# Patient Record
Sex: Female | Born: 1990 | Race: White | Hispanic: No | Marital: Married | Smoking: Never smoker
Health system: Southern US, Community
[De-identification: ages and names within clinical notes are randomized; demographics above are authoritative.]

## PROBLEM LIST (undated history)

## (undated) DIAGNOSIS — F439 Reaction to severe stress, unspecified: Secondary | ICD-10-CM

## (undated) DIAGNOSIS — R87612 Low grade squamous intraepithelial lesion on cytologic smear of cervix (LGSIL): Secondary | ICD-10-CM

## (undated) DIAGNOSIS — F32A Depression, unspecified: Secondary | ICD-10-CM

## (undated) DIAGNOSIS — R7989 Other specified abnormal findings of blood chemistry: Secondary | ICD-10-CM

## (undated) DIAGNOSIS — F329 Major depressive disorder, single episode, unspecified: Secondary | ICD-10-CM

## (undated) HISTORY — DX: Low grade squamous intraepithelial lesion on cytologic smear of cervix (LGSIL): R87.612

## (undated) HISTORY — DX: Reaction to severe stress, unspecified: F43.9

## (undated) HISTORY — DX: Depression, unspecified: F32.A

## (undated) HISTORY — PX: NO PAST SURGERIES: SHX2092

## (undated) HISTORY — DX: Other specified abnormal findings of blood chemistry: R79.89

## (undated) HISTORY — DX: Major depressive disorder, single episode, unspecified: F32.9

---

## 2015-08-08 ENCOUNTER — Encounter: Payer: Self-pay | Admitting: Obstetrics & Gynecology

## 2015-08-18 ENCOUNTER — Ambulatory Visit (INDEPENDENT_AMBULATORY_CARE_PROVIDER_SITE_OTHER): Payer: 59 | Admitting: Obstetrics and Gynecology

## 2015-08-18 ENCOUNTER — Encounter: Payer: Self-pay | Admitting: Obstetrics and Gynecology

## 2015-08-18 VITALS — BP 110/64 | HR 64 | Resp 20 | Ht 63.0 in | Wt 137.8 lb

## 2015-08-18 DIAGNOSIS — Q529 Congenital malformation of female genitalia, unspecified: Secondary | ICD-10-CM | POA: Diagnosis not present

## 2015-08-18 DIAGNOSIS — N912 Amenorrhea, unspecified: Secondary | ICD-10-CM

## 2015-08-18 DIAGNOSIS — N926 Irregular menstruation, unspecified: Secondary | ICD-10-CM | POA: Diagnosis not present

## 2015-08-18 DIAGNOSIS — Z0000 Encounter for general adult medical examination without abnormal findings: Secondary | ICD-10-CM | POA: Diagnosis not present

## 2015-08-18 LAB — ESTRADIOL: Estradiol: 58 pg/mL

## 2015-08-18 LAB — POCT URINE PREGNANCY: Preg Test, Ur: NEGATIVE

## 2015-08-18 LAB — PROLACTIN: Prolactin: 7.5 ng/mL

## 2015-08-18 LAB — POCT URINALYSIS DIPSTICK
Bilirubin, UA: NEGATIVE
Glucose, UA: NEGATIVE
KETONES UA: NEGATIVE
Leukocytes, UA: NEGATIVE
Nitrite, UA: NEGATIVE
PH UA: 5
PROTEIN UA: NEGATIVE
RBC UA: NEGATIVE
Urobilinogen, UA: NEGATIVE

## 2015-08-18 LAB — FSH/LH
FSH: 6.7 m[IU]/mL
LH: 17.3 m[IU]/mL

## 2015-08-18 LAB — TSH: TSH: 1.31 mIU/L

## 2015-08-18 MED ORDER — NORETHIN-ETH ESTRAD-FE BIPHAS 1 MG-10 MCG / 10 MCG PO TABS: 1.0000 | ORAL_TABLET | Freq: Every day | ORAL | Status: DC

## 2015-08-18 NOTE — Progress Notes (Signed)
Patient ID: Carolyn Jefferson, female   DOB: November 22, 1990, 25 y.o.   MRN: [id number] 25 y.o. G0P0000 Single Caucasian female here for  Problem visit for irregular menses and inability to have intercourse.  Always had irregular cycles in high school. Has been on combined oral contraceptives for cycle regulation.  Used a pill that had iron in it and did well, but it was expensive.  Loestrin Fe.  Stopped OPCs in June due to depression and suicidal ideation side effects.   Has had counseling at [facility] for depression.  Suicidal ideation resolved.  No medical therapy.   Has had only 2 cycles since then.  Cycle in January 2017 and March 2017.  Denies galactorrhea.  No headaches other than caffeine withdrawal.  No hair growth.   Has tried to be sexually active but not able to have penetration.  Has not used tampons.  Partner is 11 years older.  Patient states he is very patient with her.   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  From [facility].   UPT negative today here.   PCP:   None  Patient's last menstrual period was 07/09/2015 (exact date).     Period Pattern: (!) Irregular Menstrual Flow: Moderate Dysmenorrhea: (!) Mild Dysmenorrhea Symptoms: Cramping     Sexually active: No. female The current method of family planning is condoms --would use if sexually active.    Exercising: No.   Smoker:  no  Health Maintenance: Pap:  never History of abnormal Pap:  n/a MMG:  n/a Colonoscopy:  n/a BMD:   n/a  Result  n/a TDaP:  2012 Gardasil:   no Screening Labs:  Hb today: 14.2, Urine today: Neg   reports that she has never smoked. She does not have any smokeless tobacco history on file. She reports that she drinks about 1.8 - 3.0 oz of alcohol per week. She reports that she does not use illicit drugs.  Past Medical History  Diagnosis Date  . Depression     while on OCPs    History reviewed. No pertinent past surgical history.  Current Outpatient Prescriptions  Medication Sig  Dispense Refill  . Norethindrone-Ethinyl Estradiol-Fe Biphas (LO LOESTRIN FE) 1 MG-10 MCG / 10 MCG tablet Take 1 tablet by mouth daily. 1 Package 11   No current facility-administered medications for this visit.    Family History  Problem Relation Age of Onset  . Ulcerative colitis Paternal Grandfather   . Stroke Paternal Grandfather   . Cancer Other     colon ca  . Cancer Other     colon ca  . Diabetes Maternal Grandfather     ROS:  Pertinent items are noted in HPI.  Otherwise, a comprehensive ROS was negative.  Exam:   BP 110/64 mmHg  Pulse 64  Resp 20  Ht  (1.6 m)  Wt 137 lb 12.8 oz (62.506 kg)  BMI 24.42 kg/m2  LMP 07/09/2015 (Exact Date)    General appearance: alert, cooperative and appears stated age Head: Normocephalic, without obvious abnormality, atraumatic Neck: no adenopathy, supple, symmetrical, trachea midline and thyroid normal to inspection and palpation Lungs: clear to auscultation bilaterally Breasts: normal appearance, no masses or tenderness, Inspection negative, No nipple retraction or dimpling, No nipple discharge or bleeding, No axillary or supraclavicular adenopathy Heart: regular rate and rhythm Abdomen: incisions:  No.    , soft, non-tender; no masses, no organomegaly Extremities: extremities normal, atraumatic, no cyanosis or edema Skin: Skin color, texture, turgor normal. No rashes or lesions Lymph  nodes: Cervical, supraclavicular, and axillary nodes normal. No abnormal inguinal nodes palpated Neurologic: Grossly normal  Pelvic: External genitalia:  no lesions              Urethra:  normal appearing urethra with no masses, tenderness or lesions              Bartholins and Skenes: normal                 Vagina:  microperforate hymen.  Able to place small cotton Qtip only.              Cervix:  Not able to do speculum exam.              Pap taken: No. Bimanual Exam:  Uterus:  normal size, contour, position, consistency, mobility, non-tender  - bimanual done as transrectal exam.              Adnexa: normal adnexa and no mass, fullness, tenderness              Rectum:  No lesions noted.              Anus:  normal sphincter tone, no lesions  Chaperone was present for exam.  Assessment:     Oligomenorrhea.  UPT negative.  Intolerance of some combined oral contraceptives in the past with depression.  Able to take LoEstrin.  Microperforate hymen.   Plan:   Discussion of oligomenorrhea and treatment options - progesterone periodically, combined oral contraceptives, progesterone contraceptives. Labs performed.  Yes.  .   See orders.  TSH, prolactin, FSH, LH, estradiol. Prescription medication(s) given.  Yes.  .  See orders. LoLoEstrin Rx.  I discussed hymenal remnants and microperforate hymen.  I discussed hymenotomy - risks and benefits.  Risks include but are not limited to bleeding, infection, damage to surrounding organs, reactions to anesthesia.  Will pursue this.  Follow up annually and prn.   __45____ minutes face to face time of which over 50% was spent in counseling.     After visit summary provided.

## 2015-08-21 ENCOUNTER — Encounter: Payer: Self-pay | Admitting: Obstetrics and Gynecology

## 2015-08-21 ENCOUNTER — Telehealth: Payer: Self-pay | Admitting: *Deleted

## 2015-08-21 LAB — HEMOGLOBIN, FINGERSTICK: Hemoglobin, fingerstick: 14.2 g/dL (ref 12.0–16.0)

## 2015-08-21 NOTE — Telephone Encounter (Addendum)
Routing to Dr. Edward JollySilva to review and advise.   Message left to return call to [place] at [phone].

## 2015-08-21 NOTE — Telephone Encounter (Signed)
Spoke with patient. She is planning for surgical procedure with Dr. Edward JollySilva.  Advised patient that Dr. Edward JollySilva will need time to send information to surgical nurse, Billie RuddySally Yeakley, RN and Insurance coordinators. She is advised after benefits are checked, she is contacted to receive the information.  I advised that Dr. Edward JollySilva has been in surgery and that she will be contacted with information as available.  Patient states she will call back tomorrow for update.

## 2015-08-21 NOTE — Telephone Encounter (Signed)
Patient calling to check on procedure to make sure it's been pre-certed. Patient unsure of which procedure she needs to have done? Best # to reach: [phone]

## 2015-08-21 NOTE — Telephone Encounter (Signed)
Please precert and schedule excision of hymenal remnant.  Patient has a microperforate hymen.  Time needed 30 minutes.   Patient is very anxious to proceed forward with scheduling.

## 2015-08-22 NOTE — Telephone Encounter (Signed)
Chart to business office for precert.

## 2015-08-22 NOTE — Telephone Encounter (Signed)
Called patient to review benefits for a recommended procedure. Left Voicemail requesting a call back. °

## 2015-08-22 NOTE — Telephone Encounter (Signed)
Call to patient. Per DPR, ok to leave message on VM. Left message first available date is 09-05-15. Can call back and let me know if this works for her.

## 2015-08-22 NOTE — Telephone Encounter (Signed)
Spoke with pt regarding benefit for surgery. Patient understood and agreeable. Patient ready to schedule. Patient provided surgery deposit over the phone. Patient aware this is professional benefit only. Patient aware will be contacted by hospital for separate benefits. [place]ing to [place] for scheduling

## 2015-08-23 ENCOUNTER — Encounter (HOSPITAL_COMMUNITY): Payer: Self-pay | Admitting: *Deleted

## 2015-08-23 NOTE — Telephone Encounter (Signed)
Late entry: Return call to patient at approximately 1100. Patient desires to proceed with surgery date of 09-05-15. [facility] 1100 [facility]. Surgery instruction sheet reviewed with patient and printed copy mailed. See copy scanned to chart.   Routing to provider for final review. Patient agreeable to disposition. Will close encounter.

## 2015-08-23 NOTE — Telephone Encounter (Signed)
Return call to Sally. °

## 2015-08-23 NOTE — Telephone Encounter (Signed)
Patient left message on our voicemail to hold date 09/05/15 for surgery. Patient says she will call this morning to confirm date. Best # to reach: [phone]

## 2015-08-24 ENCOUNTER — Telehealth: Payer: Self-pay | Admitting: *Deleted

## 2015-08-24 NOTE — Telephone Encounter (Signed)
Call to patient. Left message to call back. Calling to schedule pre-op appointment with Dr Edward JollySilva.

## 2015-08-25 NOTE — Telephone Encounter (Signed)
Routing to provider for final review. Patient agreeable to disposition. Will close encounter.     

## 2015-08-25 NOTE — Telephone Encounter (Signed)
Patient called back scheduled pre-op for 08/28/15 at 11:30 with Dr. Edward JollySilva.

## 2015-08-28 ENCOUNTER — Encounter: Payer: Self-pay | Admitting: Obstetrics and Gynecology

## 2015-08-28 ENCOUNTER — Ambulatory Visit (INDEPENDENT_AMBULATORY_CARE_PROVIDER_SITE_OTHER): Payer: 59 | Admitting: Obstetrics and Gynecology

## 2015-08-28 VITALS — BP 120/66 | HR 72 | Resp 16 | Ht 63.0 in | Wt 138.0 lb

## 2015-08-28 DIAGNOSIS — N898 Other specified noninflammatory disorders of vagina: Secondary | ICD-10-CM

## 2015-08-28 DIAGNOSIS — Z3009 Encounter for other general counseling and advice on contraception: Secondary | ICD-10-CM | POA: Diagnosis not present

## 2015-08-28 NOTE — Progress Notes (Signed)
Patient ID: Carolyn Jefferson, female   DOB: 13-Feb-1991, 25 y.o.   MRN: [id number] GYNECOLOGY  VISIT   HPI: 25 y.o.   Single  Caucasian  female   G0P0000 with Patient's last menstrual period was 07/09/2015 (exact date).   here for surgical consult and discussion of contraceptive options.  Has a microperforate hymen.  Would like surgery for correction.  Would like to become sexually active and use tampons.   Took LoLoEstrin for only a new days and stopped it. Feels like she had a stressful moment that she did not respond well to these stresses.  She felt herself getting angry.  This is what happened in the past, and so she stopped OCPs.  She did well in the past with LoEstrin.  Menses are at least every other month.  Normal TSH, prolactin, and estradiol. LH >> FSH.  GYNECOLOGIC HISTORY: Patient's last menstrual period was 07/09/2015 (exact date). Contraception:  Condoms/LoLoEstrin Menopausal hormone therapy:  n/a Last mammogram:  n/a Last pap smear:   Never        OB History    Gravida Para Term Preterm AB TAB SAB Ectopic Multiple Living           There are no active problems to display for this patient.   Past Medical History  Diagnosis Date  . Depression     while on OCPs    No past surgical history on file.  Current Outpatient Prescriptions  Medication Sig Dispense Refill  . acetaminophen (TYLENOL) 325 MG tablet Take 650 mg by mouth every 6 (six) hours as needed for mild pain or headache.    . Norethindrone-Ethinyl Estradiol-Fe Biphas (LO LOESTRIN FE) 1 MG-10 MCG / 10 MCG tablet Take 1 tablet by mouth daily. 1 Package 11   No current facility-administered medications for this visit.     ALLERGIES: Review of patient's allergies indicates no known allergies.  Family History  Problem Relation Age of Onset  . Ulcerative colitis Paternal Grandfather   . Stroke Paternal Grandfather   . Cancer Other     colon ca  . Cancer Other     colon ca  .  Diabetes Maternal Grandfather     Social History   Social History  . Marital Status: Single    Spouse Name: N/A  . Number of Children: N/A  . Years of Education: N/A   Occupational History  . Not on file.   Social History Main Topics  . Smoking status: Never Smoker   . Smokeless tobacco: Not on file  . Alcohol Use: 1.8 - 3.0 oz/week    3-5 Standard drinks or equivalent per week     Comment: 2-5 drinks per week  . Drug Use: No  . Sexual Activity: No     Comment: never sexually active   Other Topics Concern  . Not on file   Social History Narrative    ROS:  Pertinent items are noted in HPI.  PHYSICAL EXAMINATION:    LMP 07/09/2015 (Exact Date)    General appearance: alert, cooperative and appears stated age  ASSESSMENT  Intolerance of combined oral contraceptive.  Irregular menses.  Likely anovulatory.  Microperforate hymen.   PLAN  Discussion of contraceptive options.  Micronor, barrier methods, sponge, IUDs.  Patient will use condoms and spermicide.  I discussed emergency contraception - Plan B and Ella.  Discussion of hymen revision surgery.  Risks, benefits, and alternatives discussed.  Risks include  but are not limited to bleeding, infection, damage to surrounding organs, reaction to anesthesia, DVT/PE, death. Surgical expectations and recovery discussed. Patient wishes to proceed.    An After Visit Summary was printed and given to the patient.  __15____ minutes face to face time of which over 50% was spent in counseling.

## 2015-08-29 ENCOUNTER — Telehealth: Payer: Self-pay | Admitting: Obstetrics and Gynecology

## 2015-08-29 NOTE — Telephone Encounter (Signed)
Patient is scheduled for hymenectomy-excision of hymenal remnant on 09/05/2015 with Dr.Silva. Would like to know if she will be able to play laser tag on 09/14/2015.  Routing to Dr.Miller as Dr.Silva is out of the office today.

## 2015-08-29 NOTE — Telephone Encounter (Signed)
Return call to patient, left message to call back. 

## 2015-08-29 NOTE — Telephone Encounter (Signed)
I will wait for Dr. Edward JollySilva to make this recommendation.  Routing to her.

## 2015-08-29 NOTE — Telephone Encounter (Signed)
Patient is scheduled for surgery on May 9th and is wondering if she will be ok to laser tag on May 18th. Ok to leave a detailed message on voicemail.

## 2015-08-29 NOTE — Telephone Encounter (Signed)
I would like to make recommendations for activity level after the surgery is done.  She will probably have sutures and need decreased activity for about 2 weeks post op, meaning no heavy exercise until I see her back for a 2 week check up.

## 2015-09-04 MED ORDER — DEXTROSE 5 % IV SOLN
2.0000 g | INTRAVENOUS | Status: AC
  Administered 2015-09-05: 2 g via INTRAVENOUS
  Filled 2015-09-04: qty 2

## 2015-09-04 NOTE — H&P (Signed)
Progress Notes      Carolyn SallesBrook E Amundson C Silva, MD at 08/28/2015 11:45 AM     Status: Signed       Expand All Collapse All   Patient ID: Carolyn BumpersHaleigh Jefferson, female DOB: Oct 11, 1990, 25 y.o. MRN: [id number] GYNECOLOGY VISIT  HPI: 25 y.o. Single Caucasian female  G0P0000 with Patient's last menstrual period was 07/09/2015 (exact date).  here for surgical consult and discussion of contraceptive options.  Has a microperforate hymen.  Would like surgery for correction.  Would like to become sexually active and use tampons.   Took LoLoEstrin for only a new days and stopped it. Feels like she had a stressful moment that she did not respond well to these stresses.  She felt herself getting angry.  This is what happened in the past, and so she stopped OCPs.  She did well in the past with LoEstrin.  Menses are at least every other month.  Normal TSH, prolactin, and estradiol. LH >> FSH.  GYNECOLOGIC HISTORY: Patient's last menstrual period was 07/09/2015 (exact date). Contraception: Condoms/LoLoEstrin Menopausal hormone therapy: n/a Last mammogram: n/a Last pap smear: Never   OB History    Gravida Para Term Preterm AB TAB SAB Ectopic Multiple Living   0 0 0 0 0 0 0 0 0 0        There are no active problems to display for this patient.   Past Medical History  Diagnosis Date  . Depression     while on OCPs    No past surgical history on file.  Current Outpatient Prescriptions  Medication Sig Dispense Refill  . acetaminophen (TYLENOL) 325 MG tablet Take 650 mg by mouth every 6 (six) hours as needed for mild pain or headache.    . Norethindrone-Ethinyl Estradiol-Fe Biphas (LO LOESTRIN FE) 1 MG-10 MCG / 10 MCG tablet Take 1 tablet by mouth daily. 1 Package 11   No current facility-administered medications for this visit.     ALLERGIES: Review of patient's allergies indicates no known  allergies.  Family History  Problem Relation Age of Onset  . Ulcerative colitis Paternal Grandfather   . Stroke Paternal Grandfather   . Cancer Other     colon ca  . Cancer Other     colon ca  . Diabetes Maternal Grandfather     Social History   Social History  . Marital Status: Single    Spouse Name: N/A  . Number of Children: N/A  . Years of Education: N/A   Occupational History  . Not on file.   Social History Main Topics  . Smoking status: Never Smoker   . Smokeless tobacco: Not on file  . Alcohol Use: 1.8 - 3.0 oz/week    3-5 Standard drinks or equivalent per week     Comment: 2-5 drinks per week  . Drug Use: No  . Sexual Activity: No     Comment: never sexually active   Other Topics Concern  . Not on file   Social History Narrative    ROS: Pertinent items are noted in HPI.  PHYSICAL EXAMINATION: 08/18/15 OFFICE VISIT  Exam:  BP 110/64 mmHg  Pulse 64  Resp 20  Ht 5\' 3"  (1.6 m)  Wt 137 lb 12.8 oz (62.506 kg)  BMI 24.42 kg/m2  LMP 07/09/2015 (Exact Date)  General appearance: alert, cooperative and appears stated age Head: Normocephalic, without obvious abnormality, atraumatic Neck: no adenopathy, supple, symmetrical, trachea midline and thyroid normal to inspection and palpation Lungs: clear to auscultation  bilaterally Breasts: normal appearance, no masses or tenderness, Inspection negative, No nipple retraction or dimpling, No nipple discharge or bleeding, No axillary or supraclavicular adenopathy Heart: regular rate and rhythm Abdomen: incisions: No. , soft, non-tender; no masses, no organomegaly Extremities: extremities normal, atraumatic, no cyanosis or edema Skin: Skin color, texture, turgor normal. No rashes or lesions Lymph nodes: Cervical, supraclavicular, and axillary nodes normal. No abnormal inguinal nodes palpated Neurologic: Grossly  normal  Pelvic: External genitalia: no lesions  Urethra: normal appearing urethra with no masses, tenderness or lesions  Bartholins and Skenes: normal   Vagina: microperforate hymen. Able to place small cotton Qtip only.  Cervix: Not able to do speculum exam.  Pap taken: No. Bimanual Exam: Uterus: normal size, contour, position, consistency, mobility, non-tender - bimanual done as transrectal exam.  Adnexa: normal adnexa and no mass, fullness, tenderness  Rectum: No lesions noted.  Anus: normal sphincter tone, no lesions   ASSESSMENT  Intolerance of combined oral contraceptive.  Irregular menses. Likely anovulatory.  Microperforate hymen.   PLAN  Discussion of contraceptive options. Micronor, barrier methods, sponge, IUDs.  Patient will use condoms and spermicide.  I discussed emergency contraception - Plan B and Ella.  Discussion of hymen revision surgery. Risks, benefits, and alternatives discussed. Risks include but are not limited to bleeding, infection, damage to surrounding organs, reaction to anesthesia, DVT/PE, death. Surgical expectations and recovery discussed. Patient wishes to proceed.   An After Visit Summary was printed and given to the patient.  __15____ minutes face to face time of which over 50% was spent in counseling.

## 2015-09-05 ENCOUNTER — Ambulatory Visit (HOSPITAL_COMMUNITY): Payer: 59 | Admitting: Anesthesiology

## 2015-09-05 ENCOUNTER — Ambulatory Visit (HOSPITAL_COMMUNITY)
Admission: RE | Admit: 2015-09-05 | Discharge: 2015-09-05 | Disposition: A | Discharge status: Home / Self Care | Payer: 59 | Source: Ambulatory Visit | Attending: Obstetrics and Gynecology | Admitting: Obstetrics and Gynecology

## 2015-09-05 ENCOUNTER — Encounter (HOSPITAL_COMMUNITY)
Admission: RE | Disposition: A | Discharge status: Home / Self Care | Payer: Self-pay | Source: Ambulatory Visit | Attending: Obstetrics and Gynecology

## 2015-09-05 ENCOUNTER — Encounter (HOSPITAL_COMMUNITY): Payer: Self-pay | Admitting: *Deleted

## 2015-09-05 DIAGNOSIS — Q523 Imperforate hymen: Secondary | ICD-10-CM | POA: Diagnosis not present

## 2015-09-05 DIAGNOSIS — N896 Tight hymenal ring: Secondary | ICD-10-CM | POA: Diagnosis not present

## 2015-09-05 HISTORY — PX: HYMENECTOMY: SHX5853

## 2015-09-05 LAB — CBC
HEMATOCRIT: 41.8 % (ref 36.0–46.0)
HEMOGLOBIN: 13.6 g/dL (ref 12.0–15.0)
MCH: 27.9 pg (ref 26.0–34.0)
MCHC: 32.5 g/dL (ref 30.0–36.0)
MCV: 85.7 fL (ref 78.0–100.0)
Platelets: 256 10*3/uL (ref 150–400)
RBC: 4.88 MIL/uL (ref 3.87–5.11)
RDW: 14.7 % (ref 11.5–15.5)
WBC: 11.1 10*3/uL — AB (ref 4.0–10.5)

## 2015-09-05 LAB — PREGNANCY, URINE: Preg Test, Ur: NEGATIVE

## 2015-09-05 SURGERY — HYMENECTOMY
Anesthesia: General

## 2015-09-05 MED ORDER — ONDANSETRON HCL 4 MG/2ML IJ SOLN: 4.0000 mg | Freq: Once | INTRAMUSCULAR | Status: DC | PRN

## 2015-09-05 MED ORDER — KETOROLAC TROMETHAMINE 30 MG/ML IJ SOLN
INTRAMUSCULAR | Status: DC | PRN
  Administered 2015-09-05: 30 mg via INTRAVENOUS

## 2015-09-05 MED ORDER — OXYCODONE HCL 5 MG PO TABS: 5.0000 mg | ORAL_TABLET | Freq: Once | ORAL | Status: DC | PRN

## 2015-09-05 MED ORDER — LIDOCAINE-EPINEPHRINE 0.5 %-1:200000 IJ SOLN
INTRAMUSCULAR | Status: DC | PRN
  Administered 2015-09-05: 6 mL

## 2015-09-05 MED ORDER — DEXAMETHASONE SODIUM PHOSPHATE 4 MG/ML IJ SOLN
INTRAMUSCULAR | Status: AC
  Filled 2015-09-05: qty 1

## 2015-09-05 MED ORDER — LIDOCAINE HCL (CARDIAC) 20 MG/ML IV SOLN
INTRAVENOUS | Status: AC
  Filled 2015-09-05: qty 5

## 2015-09-05 MED ORDER — PROPOFOL 10 MG/ML IV BOLUS
INTRAVENOUS | Status: AC
  Filled 2015-09-05: qty 20

## 2015-09-05 MED ORDER — KETOROLAC TROMETHAMINE 30 MG/ML IJ SOLN
INTRAMUSCULAR | Status: AC
  Filled 2015-09-05: qty 1

## 2015-09-05 MED ORDER — ONDANSETRON HCL 4 MG/2ML IJ SOLN
INTRAMUSCULAR | Status: DC | PRN
  Administered 2015-09-05: 4 mg via INTRAVENOUS

## 2015-09-05 MED ORDER — GLYCOPYRROLATE 0.2 MG/ML IJ SOLN
INTRAMUSCULAR | Status: AC
  Filled 2015-09-05: qty 1

## 2015-09-05 MED ORDER — SCOPOLAMINE 1 MG/3DAYS TD PT72
1.0000 | MEDICATED_PATCH | Freq: Once | TRANSDERMAL | Status: DC
  Administered 2015-09-05: 1.5 mg via TRANSDERMAL

## 2015-09-05 MED ORDER — SCOPOLAMINE 1 MG/3DAYS TD PT72
MEDICATED_PATCH | TRANSDERMAL | Status: AC
  Administered 2015-09-05: 1.5 mg via TRANSDERMAL
  Filled 2015-09-05: qty 1

## 2015-09-05 MED ORDER — LACTATED RINGERS IV SOLN
INTRAVENOUS | Status: DC
  Administered 2015-09-05 (×2): via INTRAVENOUS

## 2015-09-05 MED ORDER — LIDOCAINE HCL (CARDIAC) 20 MG/ML IV SOLN
INTRAVENOUS | Status: DC | PRN
Start: 2015-09-05 — End: 2015-09-05
  Administered 2015-09-05: 100 mg via INTRAVENOUS

## 2015-09-05 MED ORDER — PROPOFOL 10 MG/ML IV BOLUS
INTRAVENOUS | Status: DC | PRN
  Administered 2015-09-05: 200 mg via INTRAVENOUS

## 2015-09-05 MED ORDER — GLYCOPYRROLATE 0.2 MG/ML IJ SOLN
INTRAMUSCULAR | Status: DC | PRN
  Administered 2015-09-05: 0.1 mg via INTRAVENOUS

## 2015-09-05 MED ORDER — LIDOCAINE-EPINEPHRINE 0.5 %-1:200000 IJ SOLN
INTRAMUSCULAR | Status: AC
  Filled 2015-09-05: qty 1

## 2015-09-05 MED ORDER — SODIUM CHLORIDE 0.9 % IR SOLN
Status: DC | PRN
  Administered 2015-09-05: 1000 mL

## 2015-09-05 MED ORDER — LACTATED RINGERS IV SOLN: INTRAVENOUS | Status: DC

## 2015-09-05 MED ORDER — OXYCODONE-ACETAMINOPHEN 5-325 MG PO TABS: 1.0000 | ORAL_TABLET | ORAL | Status: DC | PRN

## 2015-09-05 MED ORDER — OXYCODONE HCL 5 MG/5ML PO SOLN: 5.0000 mg | Freq: Once | ORAL | Status: DC | PRN

## 2015-09-05 MED ORDER — FENTANYL CITRATE (PF) 100 MCG/2ML IJ SOLN
INTRAMUSCULAR | Status: AC
  Filled 2015-09-05: qty 2

## 2015-09-05 MED ORDER — DEXAMETHASONE SODIUM PHOSPHATE 10 MG/ML IJ SOLN
INTRAMUSCULAR | Status: DC | PRN
  Administered 2015-09-05: 4 mg via INTRAVENOUS

## 2015-09-05 MED ORDER — MIDAZOLAM HCL 2 MG/2ML IJ SOLN
INTRAMUSCULAR | Status: AC
  Filled 2015-09-05: qty 2

## 2015-09-05 MED ORDER — FENTANYL CITRATE (PF) 100 MCG/2ML IJ SOLN: 25.0000 ug | INTRAMUSCULAR | Status: DC | PRN

## 2015-09-05 MED ORDER — MIDAZOLAM HCL 2 MG/2ML IJ SOLN
INTRAMUSCULAR | Status: DC | PRN
  Administered 2015-09-05: 2 mg via INTRAVENOUS

## 2015-09-05 MED ORDER — ONDANSETRON HCL 4 MG/2ML IJ SOLN
INTRAMUSCULAR | Status: AC
  Filled 2015-09-05: qty 2

## 2015-09-05 MED ORDER — FENTANYL CITRATE (PF) 100 MCG/2ML IJ SOLN
INTRAMUSCULAR | Status: DC | PRN
  Administered 2015-09-05 (×2): 50 ug via INTRAVENOUS

## 2015-09-05 MED ORDER — IBUPROFEN 800 MG PO TABS: 800.0000 mg | ORAL_TABLET | Freq: Three times a day (TID) | ORAL | Status: DC | PRN

## 2015-09-05 SURGICAL SUPPLY — 14 items
CLOTH BEACON ORANGE TIMEOUT ST (SAFETY) ×3 IMPLANT
DECANTER SPIKE VIAL GLASS SM (MISCELLANEOUS) ×3 IMPLANT
GLOVE BIO SURGEON STRL SZ 6.5 (GLOVE) ×2 IMPLANT
GLOVE BIO SURGEONS STRL SZ 6.5 (GLOVE) ×1
GLOVE BIOGEL PI IND STRL 7.0 (GLOVE) ×1 IMPLANT
GLOVE BIOGEL PI INDICATOR 7.0 (GLOVE) ×2
GOWN STRL REUS W/TWL LRG LVL3 (GOWN DISPOSABLE) ×6 IMPLANT
NEEDLE HYPO 22GX1.5 SAFETY (NEEDLE) ×3 IMPLANT
PACK VAGINAL WOMENS (CUSTOM PROCEDURE TRAY) ×3 IMPLANT
PAD OB MATERNITY 4.3X12.25 (PERSONAL CARE ITEMS) ×3 IMPLANT
SUT VIC AB 4-0 PS2 27 (SUTURE) ×3 IMPLANT
TOWEL OR 17X24 6PK STRL BLUE (TOWEL DISPOSABLE) ×6 IMPLANT
TRAY FOLEY CATH SILVER 14FR (SET/KITS/TRAYS/PACK) ×3 IMPLANT
WATER STERILE IRR 1000ML POUR (IV SOLUTION) ×3 IMPLANT

## 2015-09-05 NOTE — Anesthesia Preprocedure Evaluation (Signed)
Anesthesia Evaluation  Patient identified by MRN, date of birth, ID band Patient awake    Reviewed: Allergy & Precautions, NPO status , Patient's Chart, lab work & pertinent test results  Airway Mallampati: II  TM Distance: >3 FB Neck ROM: Full    Dental  (+) Teeth Intact, Dental Advisory Given   Pulmonary    breath sounds clear to auscultation       Cardiovascular  Rhythm:Regular Rate:Normal     Neuro/Psych    GI/Hepatic   Endo/Other    Renal/GU      Musculoskeletal   Abdominal   Peds  Hematology   Anesthesia Other Findings   Reproductive/Obstetrics                             Anesthesia Physical Anesthesia Plan  ASA: I  Anesthesia Plan:    Post-op Pain Management:    Induction: Intravenous  Airway Management Planned: LMA  Additional Equipment:   Intra-op Plan:   Post-operative Plan:   Informed Consent: I have reviewed the patients History and Physical, chart, labs and discussed the procedure including the risks, benefits and alternatives for the proposed anesthesia with the patient or authorized representative who has indicated his/her understanding and acceptance.   Dental advisory given  Plan Discussed with: CRNA and Anesthesiologist  Anesthesia Plan Comments:         Anesthesia Quick Evaluation

## 2015-09-05 NOTE — Transfer of Care (Signed)
Immediate Anesthesia Transfer of Care Note  Patient: Carolyn Jefferson  Procedure(s) Performed: Procedure(s): HYMENECTOMY excision of hymenal remnant  (N/A)  Patient Location: PACU  Anesthesia Type:General  Level of Consciousness: awake, alert  and oriented  Airway & Oxygen Therapy: Patient Spontanous Breathing and Patient connected to nasal cannula oxygen  Post-op Assessment: Report given to RN, Post -op Vital signs reviewed and stable and Patient moving all extremities  Post vital signs: Reviewed and stable  Last Vitals:  Filed Vitals:   09/05/15 1001  BP: 118/79  Pulse: 69  Temp: 36.8 C  Resp: 18    Last Pain: There were no vitals filed for this visit.    Patients Stated Pain Goal: 4 (09/05/15 1001)  Complications: No apparent anesthesia complications

## 2015-09-05 NOTE — Discharge Instructions (Addendum)
Carolyn Jefferson,  You did have a hymenal band which I removed.  The bottom part of the hymen was also very tall and partially closing the vagina, so I opened it there.  I am very pleased with the result!  You do have a series of sutures in the hymen itself.  These will dissolve.  I expect that the surgery site may sting a bit, so I am prescribing Motrin and Percocet for pain control.   Using an ice pack and taking sitz baths may also help to reduce discomfort.  Thank you,   Conley SimmondsBrook Silva, MD    Treatment for Imperforate Hymen, Care After Refer to this sheet in the next few weeks. These instructions provide you with information about caring for yourself after your procedure. Your health care provider may also give you more specific instructions. Your treatment has been planned according to current medical practices, but problems sometimes occur. Call your health care provider if you have any problems or questions after your procedure. WHAT TO EXPECT AFTER THE PROCEDURE  After your procedure, it is common to have:  Pain or discomfort at the incision site.  Pain or cramping in the vagina or lower abdomen. HOME CARE INSTRUCTIONS  Take medicines only as directed by your health care provider. Do not take aspirin because it can cause bleeding.  Your health care provider may give you topical cream or ointment to apply on the stitches. Use as directed.  Your health care provider may give you a prescription for pain medicines.  Do not have sex until your health care provider says it is okay.  Do not douche until your health care provider tells you it is okay.  Do not use tampons until your health care provider tells you it is okay.  Do not lift anything that is more than 5 lb (2.3 kg) until your health care provider tells you it is okay.  You may return to your usual diet unless you are having problems with nausea or vomiting.  You may apply ice to the area between your thighs (perineum) to prevent  swelling.  Put ice in a plastic bag.  Place a towel between your skin and the bag.  Leave the ice on for 20 minutes, 2-3 times per day.  A few days after the procedure, you may take warm sitz baths 2-3 times per day to help with any discomfort and healing.  Keep all follow-up visits as directed by your health care provider. This is important. SEEK MEDICAL CARE IF:  You have a fever.  You have signs of infection that may include:  Abnormal vaginal discharge, such as pus or bad-smelling drainage coming from the vagina.  Increased swelling.  Increased redness.  Increased pain.  You develop a rash.  You develop vaginal bleeding.  You have painful or bloody urination.  You have frequent vomiting and you are unable to keep fluids or fluids down.  You have problems with your medicines. SEEK IMMEDIATE MEDICAL CARE IF:  You become weak or you faint.  You have vaginal bleeding that does not stop.   This information is not intended to replace advice given to you by your health care provider. Make sure you discuss any questions you have with your health care provider.   Document Released: 05/06/2014 Document Reviewed: 05/06/2014 Elsevier Interactive Patient Education 2016 [employer]   Post Anesthesia Home Care Instructions  Activity: Get plenty of rest for the remainder of the day. A responsible adult should stay with  you for 24 hours following the procedure.  For the next 24 hours, DO NOT: -Drive a car -Advertising copywriter -Drink alcoholic beverages -Take any medication unless instructed by your physician -Make any legal decisions or sign important papers.  Meals: Start with liquid foods such as gelatin or soup. Progress to regular foods as tolerated. Avoid greasy, spicy, heavy foods. If nausea and/or vomiting occur, drink only clear liquids until the nausea and/or vomiting subsides. Call your physician if vomiting continues.  Special Instructions/Symptoms: Your  throat may feel dry or sore from the anesthesia or the breathing tube placed in your throat during surgery. If this causes discomfort, gargle with warm salt water. The discomfort should disappear within 24 hours.  If you had a scopolamine patch placed behind your ear for the management of post- operative nausea and/or vomiting:  1. The medication in the patch is effective for 72 hours, after which it should be removed.  Wrap patch in a tissue and discard in the trash. Wash hands thoroughly with soap and water. 2. You may remove the patch earlier than 72 hours if you experience unpleasant side effects which may include dry mouth, dizziness or visual disturbances. 3. Avoid touching the patch. Wash your hands with soap and water after contact with the patch.

## 2015-09-05 NOTE — Brief Op Note (Signed)
09/05/2015  12:03 PM  PATIENT:  Carolyn Jefferson  25 y.o. female  PRE-OPERATIVE DIAGNOSIS:  Microperforate hymen  POST-OPERATIVE DIAGNOSIS:  Hymenal remnant.  PROCEDURE:  Procedure(s): HYMENECTOMY excision of hymenal remnant  (N/A)  SURGEON:  Surgeon(s) and Role:    * Mei Suits E Ardell IsaacsAmundson C Silva, MD - Primary  PHYSICIAN ASSISTANT: NA  ASSISTANTS: none   ANESTHESIA:   local and LMA.  EBL:  Total I/O In: 1000 [I.V.:1000] Out: 70 [Urine:60; Blood:10]  BLOOD ADMINISTERED:none  DRAINS: none   LOCAL MEDICATIONS USED:  LIDOCAINE   SPECIMEN:  No Specimen  DISPOSITION OF SPECIMEN:  N/A  COUNTS:  YES  TOURNIQUET:  * No tourniquets in log *  DICTATION: .Other Dictation: Dictation Number    PLAN OF CARE: Discharge to home after PACU  PATIENT DISPOSITION:  PACU - hemodynamically stable.   Delay start of Pharmacological VTE agent (>24hrs) due to surgical blood loss or risk of bleeding: not applicable

## 2015-09-05 NOTE — Anesthesia Postprocedure Evaluation (Signed)
Anesthesia Post Note  Patient: Carolyn BumpersHaleigh Jefferson  Procedure(s) Performed: Procedure(s) (LRB): HYMENECTOMY excision of hymenal remnant  (N/A)  Patient location during evaluation: PACU Anesthesia Type: General Level of consciousness: awake, awake and alert and oriented Pain management: pain level controlled Vital Signs Assessment: post-procedure vital signs reviewed and stable Respiratory status: spontaneous breathing, nonlabored ventilation and respiratory function stable Cardiovascular status: blood pressure returned to baseline Anesthetic complications: no    Last Vitals:  Filed Vitals:   09/05/15 1001 09/05/15 1215  BP: 118/79 110/67  Pulse: 69 71  Temp: 36.8 C 36.1 C  Resp: 18 16    Last Pain:  Filed Vitals:   09/05/15 1222  PainSc: 3                  Laurinda Carreno COKER

## 2015-09-05 NOTE — Progress Notes (Signed)
Update to History and Physical  No marked change in status since office pre-op visit.   Patient examined.   OK to proceed with surgery. 

## 2015-09-05 NOTE — Anesthesia Procedure Notes (Signed)
Procedure Name: LMA Insertion Date/Time: 09/05/2015 11:09 AM Performed by: Elgie CongoMALINOVA, Ariyanna Oien H Pre-anesthesia Checklist: Patient identified, Emergency Drugs available, Patient being monitored and Suction available Patient Re-evaluated:Patient Re-evaluated prior to inductionOxygen Delivery Method: Circle system utilized Preoxygenation: Pre-oxygenation with 100% oxygen Intubation Type: IV induction LMA: LMA inserted LMA Size: 4.0 Number of attempts: 1 Placement Confirmation: positive ETCO2 and breath sounds checked- equal and bilateral Tube secured with: Tape Dental Injury: Teeth and Oropharynx as per pre-operative assessment

## 2015-09-06 ENCOUNTER — Encounter (HOSPITAL_COMMUNITY): Payer: Self-pay | Admitting: Obstetrics and Gynecology

## 2015-09-06 NOTE — Op Note (Signed)
NAMSantiago Bumpers:  EVANS, Ziggy               ACCOUNT NO.:  [id number]  MEDICAL RECORD NO.:  [id number]  LOCATION:  [facility]                          FACILITY:  [facility]  PHYSICIAN:  Randye LoboBrook E. Silva, M.D.   DATE OF BIRTH:  08-01-1990  DATE OF PROCEDURE:  09/05/2015 DATE OF DISCHARGE:  09/05/2015                              OPERATIVE REPORT   PREOPERATIVE DIAGNOSIS:  Microperforate hymen.  POSTOPERATIVE DIAGNOSIS:  Hymenal remnant.  PROCEDURE:  Excision of hymenal remnant.  SURGEON:  Randye LoboBrook E. Silva, M.D.  ANESTHESIA:  LMA, local with 1% lidocaine and 1:200,000.  IV FLUIDS:  1000 mL Ringer's lactate.  ESTIMATED BLOOD LOSS:  10 mL.  URINE OUTPUT:  60 mL.  COMPLICATIONS:  None.  INDICATIONS FOR THE PROCEDURE:  The patient is a 25 year old, gravida 710 Caucasian female, who presents with inability to use tampons and have sexual intercourse.  On office examination, the patient is noted to have a micro perforate hymen.  A plan is made to proceed with an excision of hymenal remnant and examination under anesthesia after the risks, benefits, and alternatives are reviewed.  FINDINGS:  Examination under anesthesia revealed a hymenal band which stretched from the 2 o' clock to the 9 o' clock position.  This measured approximately 1 cm in thickness.  The posterior portion of the hymen was very prominent from the 3 o'clock to the 8 o'clock position.  After excision of the hymenal remnant was performed, a speculum exam was possible and demonstrated a normal cervix and vaginal walls.  A bimanual exam demonstrated a normal and non-enlarged uterus.  No adnexal masses were appreciated.  SPECIMEN:  None.  DESCRIPTION OF THE PROCEDURE:  The patient was re-identified in the preoperative hold area.  She did receive cefotetan IV for antibiotic prophylaxis.  She received  PAS stockings for DVT prophylaxis.  In the operating room, the patient was placed in the dorsal lithotomy position with Allen stirrups.  The  patient was sterilely prepped and catheterized of urine with a red rubber catheter.  She was then sterilely draped.  An examination under anesthesia was began at this time.  A hemostat clamp was used to probe the area of the hymenal folds and the hymenal band was appreciated at this time.  The Foley catheter was placed inside the bladder and left to gravity drainage throughout the procedure.  The hymenal band was injected locally with 1% lidocaine with epinephrine 1:100,000.  The band was then incised in the midline with a scalpel.  The remainder of the hymen was examined at this time and the posterior portion was noted to be quite prominent and like a small curtain which covered over the posterior or inferior aspect of the hymen.  This area was then held with 2 Allis clamps, injected with local 1% lidocaine with 1:100,000 epinephrine and it was then incised vertically in order to create normal hymenal anatomy.  A series of interrupted 4-0 Vicryl sutures were then placed for hemostasis.  There was a small area at the 6 o'clock position of the hymen which was bleeding and this responded to a figure-of-eight suture of 2-0 Vicryl.  Hemostasis was good at this time.  A speculum  exam and a bimanual exam were performed.  The findings are as noted above.  This concluded the patient's procedure.  There were no complications. All needle, instrument, and sponge counts were correct.  The patient is escorted to the recovery room in stable and awake condition.     Randye Lobo, M.D.     BES/MEDQ  D:  09/05/2015  T:  09/06/2015  Job:  [id number]

## 2015-09-08 NOTE — Telephone Encounter (Signed)
Patient had surgery for a hymenectomy-excision of hymenal remnant on 09/05/2015 with Dr.Silva.   Routing to provider for final review. Patient agreeable to disposition. Will close encounter.

## 2015-09-22 ENCOUNTER — Encounter: Payer: Self-pay | Admitting: Obstetrics and Gynecology

## 2015-09-22 ENCOUNTER — Ambulatory Visit (INDEPENDENT_AMBULATORY_CARE_PROVIDER_SITE_OTHER): Payer: 59 | Admitting: Obstetrics and Gynecology

## 2015-09-22 VITALS — BP 110/68 | HR 70 | Ht 63.0 in | Wt 138.4 lb

## 2015-09-22 DIAGNOSIS — Z9889 Other specified postprocedural states: Secondary | ICD-10-CM

## 2015-09-22 NOTE — Progress Notes (Signed)
Patient ID: Carolyn Jefferson, female   DOB: [birth year], 25 y.o.   MRN: [id number] GYNECOLOGY  VISIT   HPI: 25 y.o.   Single  Caucasian  female   [id number] with Patient's last menstrual period was 08/28/2015 (exact date).   here for follow up HYMENECTOMY excision of hymenal remnant (N/A ) [[id number] CPT (R)].   Surgery on 09/05/15.  Did not need the oxycodone.  Used a few ibuprofen.  Bled for 10 days post op.  GYNECOLOGIC HISTORY: Patient's last menstrual period was 08/28/2015 (exact date). Contraception:  Condoms Menopausal hormone therapy:  n/a Last mammogram:  n/a Last pap smear:   never        OB History    Gravida Para Term Preterm AB TAB SAB Ectopic Multiple Living           There are no active problems to display for this patient.   Past Medical History  Diagnosis Date  . Depression     while on OCPs    Past Surgical History  Procedure Laterality Date  . No past surgeries    . Hymenectomy N/A 09/05/2015    Procedure: HYMENECTOMY excision of hymenal remnant ;  Surgeon: Patton Salles, MD;  Location: [facility];  Service: Gynecology;  Laterality: N/A;    No current outpatient prescriptions on file.   No current facility-administered medications for this visit.     ALLERGIES: Review of patient's allergies indicates no known allergies.  Family History  Problem Relation Age of Onset  . Ulcerative colitis Paternal Grandfather   . Stroke Paternal Grandfather   . Cancer Other     colon ca  . Cancer Other     colon ca  . Diabetes Maternal Grandfather     Social History   Social History  . Marital Status: Single    Spouse Name: N/A  . Number of Children: N/A  . Years of Education: N/A   Occupational History  . Not on file.   Social History Main Topics  . Smoking status: Never Smoker   . Smokeless tobacco: Not on file  . Alcohol Use: 1.8 - 3.0 oz/week    3-5 Standard drinks or equivalent per week     Comment: 2-5 drinks per week  .  Drug Use: No  . Sexual Activity: No     Comment: never sexually active   Other Topics Concern  . Not on file   Social History Narrative    ROS:  Pertinent items are noted in HPI.  PHYSICAL EXAMINATION:    BP 110/68 mmHg  Pulse 70  Ht  (1.6 m)  Wt 138 lb 6.4 oz (62.778 kg)  BMI 24.52 kg/m2  LMP 08/28/2015 (Exact Date)    General appearance: alert, cooperative and appears stated age    Pelvic: External genitalia:  no lesions.              Urethra:  normal appearing urethra with no masses, tenderness or lesions              Bartholins and Skenes: normal                 Vagina: 3 suture of Vicryl still present at the hymenal opening.              Introital exam:  Patency of the hymen noted.             Chaperone was  present for exam.  ASSESSMENT  Status post excision of hymenal remnant. Doing well.  PLAN  Wait to initiate any sexual activity for 2 additional weeks.  I recommended digital penetration prior to attempting actual intercourse.  Wait a full 6 weeks post op prior to attempting intercourse.  Use lubricants.  Condoms for pregnancy prevention.  Patient will consider a Paragard IUD.  Follow up in one year and prn.    An After Visit Summary was printed and given to the patient.  _15_____ minutes face to face time of which over 50% was spent in counseling.

## 2015-12-10 ENCOUNTER — Encounter: Payer: Self-pay | Admitting: Obstetrics and Gynecology

## 2015-12-12 ENCOUNTER — Telehealth: Payer: Self-pay | Admitting: *Deleted

## 2015-12-12 NOTE — Telephone Encounter (Signed)
Princes,  I tried to to reach you by phone to discuss your requests and was unable to reach you.  Please call the office and schedule an appointment with Dr Edward JollySilva. She will need to recheck the area from the surgery as well as discussing all of your contraceptive options, including the minipill, and the benefits and risks of each. This is much easier to do when you can talk with your provider directly. Please let me know when I can schedule your appointment.  Thank you,  Billie RuddySally Farhana Fellows, RN Clinical Supervisor   ===View-only below this line===   ----- Message -----    From: Santiago BumpersHaleigh Jefferson    Sent: 12/11/2015  9:57 PM EDT      To: Melony OverlyBrook A Silva, MD Subject: RE: Non-Urgent Medical Question  Hi Brook,  Thank you for your reply. I believe I have fully healed. I am not experiencing any discomfort; my problem is that my boyfriend has difficulty using condoms, so I wanted to explore alternative contraception. Do you think the minipill would be an effective alternative since the regular pill has proven to be too much hormones?  Take care,  Carolyn Jefferson

## 2015-12-12 NOTE — Telephone Encounter (Signed)
See previous My Chart messages from patient. Call to patient to schedule appointment to discuss contraceptive options.left message to call back.

## 2015-12-13 ENCOUNTER — Other Ambulatory Visit: Payer: Self-pay | Admitting: Obstetrics and Gynecology

## 2015-12-13 MED ORDER — NORETHINDRONE 0.35 MG PO TABS: 1.0000 | ORAL_TABLET | Freq: Every day | ORAL | 1 refills | Status: DC

## 2016-01-04 NOTE — Telephone Encounter (Signed)
No Patient response. Ok to close encounter?    

## 2016-01-05 NOTE — Telephone Encounter (Signed)
I had communication with patient through My Chart after receiving more information from her.  I started her on Micronor.  She will call back to let me know how she is doing. She does not need an additional post op check.  I have closed the encounter.

## 2016-01-24 ENCOUNTER — Other Ambulatory Visit: Payer: Self-pay | Admitting: Obstetrics and Gynecology

## 2016-01-24 ENCOUNTER — Encounter: Payer: Self-pay | Admitting: Obstetrics and Gynecology

## 2016-01-24 MED ORDER — NORETHINDRONE 0.35 MG PO TABS: 1.0000 | ORAL_TABLET | Freq: Every day | ORAL | 8 refills | Status: DC

## 2016-02-18 ENCOUNTER — Encounter: Payer: Self-pay | Admitting: Obstetrics and Gynecology

## 2016-02-19 MED ORDER — NORETHINDRONE 0.35 MG PO TABS: 1.0000 | ORAL_TABLET | Freq: Every day | ORAL | 2 refills | Status: DC

## 2016-04-29 DIAGNOSIS — F439 Reaction to severe stress, unspecified: Secondary | ICD-10-CM

## 2016-04-29 HISTORY — DX: Reaction to severe stress, unspecified: F43.9

## 2016-09-27 ENCOUNTER — Ambulatory Visit (INDEPENDENT_AMBULATORY_CARE_PROVIDER_SITE_OTHER): Payer: 59 | Admitting: Obstetrics and Gynecology

## 2016-09-27 ENCOUNTER — Other Ambulatory Visit (HOSPITAL_COMMUNITY)
Admission: RE | Admit: 2016-09-27 | Discharge: 2016-09-27 | Disposition: A | Discharge status: Home / Self Care | Payer: 59 | Source: Ambulatory Visit | Attending: Obstetrics and Gynecology | Admitting: Obstetrics and Gynecology

## 2016-09-27 ENCOUNTER — Encounter: Payer: Self-pay | Admitting: Obstetrics and Gynecology

## 2016-09-27 VITALS — BP 110/72 | HR 78 | Resp 16 | Ht 63.5 in | Wt 130.8 lb

## 2016-09-27 DIAGNOSIS — Z01411 Encounter for gynecological examination (general) (routine) with abnormal findings: Secondary | ICD-10-CM | POA: Insufficient documentation

## 2016-09-27 DIAGNOSIS — F439 Reaction to severe stress, unspecified: Secondary | ICD-10-CM

## 2016-09-27 DIAGNOSIS — Z01419 Encounter for gynecological examination (general) (routine) without abnormal findings: Secondary | ICD-10-CM

## 2016-09-27 DIAGNOSIS — N912 Amenorrhea, unspecified: Secondary | ICD-10-CM

## 2016-09-27 DIAGNOSIS — N870 Mild cervical dysplasia: Secondary | ICD-10-CM | POA: Diagnosis not present

## 2016-09-27 DIAGNOSIS — Z113 Encounter for screening for infections with a predominantly sexual mode of transmission: Secondary | ICD-10-CM | POA: Diagnosis present

## 2016-09-27 DIAGNOSIS — R87612 Low grade squamous intraepithelial lesion on cytologic smear of cervix (LGSIL): Secondary | ICD-10-CM

## 2016-09-27 HISTORY — DX: Low grade squamous intraepithelial lesion on cytologic smear of cervix (LGSIL): R87.612

## 2016-09-27 LAB — POCT URINE PREGNANCY: PREG TEST UR: NEGATIVE

## 2016-09-27 MED ORDER — SERTRALINE HCL 50 MG PO TABS
50.0000 mg | ORAL_TABLET | Freq: Every day | ORAL | 1 refills | Status: DC
Start: 2016-09-27 — End: 2016-11-01

## 2016-09-27 MED ORDER — NORETHINDRONE 0.35 MG PO TABS: 1.0000 | ORAL_TABLET | Freq: Every day | ORAL | 3 refills | Status: DC

## 2016-09-27 NOTE — Patient Instructions (Signed)
EXERCISE AND DIET:  We recommended that you start or continue a regular exercise program for good health. Regular exercise means any activity that makes your heart beat faster and makes you sweat.  We recommend exercising at least 30 minutes per day at least 3 days a week, preferably 4 or 5.  We also recommend a diet low in fat and sugar.  Inactivity, poor dietary choices and obesity can cause diabetes, heart attack, stroke, and kidney damage, among others.    ALCOHOL AND SMOKING:  Women should limit their alcohol intake to no more than 7 drinks/beers/glasses of wine (combined, not each!) per week. Moderation of alcohol intake to this level decreases your risk of breast cancer and liver damage. And of course, no recreational drugs are part of a healthy lifestyle.  And absolutely no smoking or even second hand smoke. Most people know smoking can cause heart and lung diseases, but did you know it also contributes to weakening of your bones? Aging of your skin?  Yellowing of your teeth and nails?  CALCIUM AND VITAMIN D:  Adequate intake of calcium and Vitamin D are recommended.  The recommendations for exact amounts of these supplements seem to change often, but generally speaking 600 mg of calcium (either carbonate or citrate) and 800 units of Vitamin D per day seems prudent. Certain women may benefit from higher intake of Vitamin D.  If you are among these women, your doctor will have told you during your visit.    PAP SMEARS:  Pap smears, to check for cervical cancer or precancers,  have traditionally been done yearly, although recent scientific advances have shown that most women can have pap smears less often.  However, every woman still should have a physical exam from her gynecologist every year. It will include a breast check, inspection of the vulva and vagina to check for abnormal growths or skin changes, a visual exam of the cervix, and then an exam to evaluate the size and shape of the uterus and  ovaries.  And after 24 years of age, a rectal exam is indicated to check for rectal cancers. We will also provide age appropriate advice regarding health maintenance, like when you should have certain vaccines, screening for sexually transmitted diseases, bone density testing, colonoscopy, mammograms, etc.   MAMMOGRAMS:  All women over 40 years old should have a yearly mammogram. Many facilities now offer a "3D" mammogram, which may cost around $50 extra out of pocket. If possible,  we recommend you accept the option to have the 3D mammogram performed.  It both reduces the number of women who will be called back for extra views which then turn out to be normal, and it is better than the routine mammogram at detecting truly abnormal areas.    COLONOSCOPY:  Colonoscopy to screen for colon cancer is recommended for all women at age 50.  We know, you hate the idea of the prep.  We agree, BUT, having colon cancer and not knowing it is worse!!  Colon cancer so often starts as a polyp that can be seen and removed at colonscopy, which can quite literally save your life!  And if your first colonoscopy is normal and you have no family history of colon cancer, most women don't have to have it again for 10 years.  Once every ten years, you can do something that may end up saving your life, right?  We will be happy to help you get it scheduled when you are ready.    Be sure to check your insurance coverage so you understand how much it will cost.  It may be covered as a preventative service at no cost, but you should check your particular policy.     Sertraline tablets What is this medicine? SERTRALINE (SER tra leen) is used to treat depression. It may also be used to treat obsessive compulsive disorder, panic disorder, post-trauma stress, premenstrual dysphoric disorder (PMDD) or social anxiety. This medicine may be used for other purposes; ask your health care provider or pharmacist if you have questions. COMMON BRAND  NAME(S): Zoloft What should I tell my health care provider before I take this medicine? They need to know if you have any of these conditions: -bleeding disorders -bipolar disorder or a family history of bipolar disorder -glaucoma -heart disease -high blood pressure -history of irregular heartbeat -history of low levels of calcium, magnesium, or potassium in the blood -if you often drink alcohol -liver disease -receiving electroconvulsive therapy -seizures -suicidal thoughts, plans, or attempt; a previous suicide attempt by you or a family member -take medicines that treat or prevent blood clots -thyroid disease -an unusual or allergic reaction to sertraline, other medicines, foods, dyes, or preservatives -pregnant or trying to get pregnant -breast-feeding How should I use this medicine? Take this medicine by mouth with a glass of water. Follow the directions on the prescription label. You can take it with or without food. Take your medicine at regular intervals. Do not take your medicine more often than directed. Do not stop taking this medicine suddenly except upon the advice of your doctor. Stopping this medicine too quickly may cause serious side effects or your condition may worsen. A special MedGuide will be given to you by the pharmacist with each prescription and refill. Be sure to read this information carefully each time. Talk to your pediatrician regarding the use of this medicine in children. While this drug may be prescribed for children as young as 7 years for selected conditions, precautions do apply. Overdosage: If you think you have taken too much of this medicine contact a poison control center or emergency room at once. NOTE: This medicine is only for you. Do not share this medicine with others. What if I miss a dose? If you miss a dose, take it as soon as you can. If it is almost time for your next dose, take only that dose. Do not take double or extra doses. What may  interact with this medicine? Do not take this medicine with any of the following medications: -cisapride -dofetilide -dronedarone -linezolid -MAOIs like Carbex, Eldepryl, Marplan, Nardil, and Parnate -methylene blue (injected into a vein) -pimozide -thioridazine This medicine may also interact with the following medications: -alcohol -amphetamines -aspirin and aspirin-like medicines -certain medicines for depression, anxiety, or psychotic disturbances -certain medicines for fungal infections like ketoconazole, fluconazole, posaconazole, and itraconazole -certain medicines for irregular heart beat like flecainide, quinidine, propafenone -certain medicines for migraine headaches like almotriptan, eletriptan, frovatriptan, naratriptan, rizatriptan, sumatriptan, zolmitriptan -certain medicines for sleep -certain medicines for seizures like carbamazepine, valproic acid, phenytoin -certain medicines that treat or prevent blood clots like warfarin, enoxaparin, dalteparin -cimetidine -digoxin -diuretics -fentanyl -isoniazid -lithium -NSAIDs, medicines for pain and inflammation, like ibuprofen or naproxen -other medicines that prolong the QT interval (cause an abnormal heart rhythm) -rasagiline -safinamide -supplements like St. John's wort, kava kava, valerian -tolbutamide -tramadol -tryptophan This list may not describe all possible interactions. Give your health care provider a list of all the medicines, herbs, non-prescription drugs, or   dietary supplements you use. Also tell them if you smoke, drink alcohol, or use illegal drugs. Some items may interact with your medicine. What should I watch for while using this medicine? Tell your doctor if your symptoms do not get better or if they get worse. Visit your doctor or health care professional for regular checks on your progress. Because it may take several weeks to see the full effects of this medicine, it is important to continue your  treatment as prescribed by your doctor. Patients and their families should watch out for new or worsening thoughts of suicide or depression. Also watch out for sudden changes in feelings such as feeling anxious, agitated, panicky, irritable, hostile, aggressive, impulsive, severely restless, overly excited and hyperactive, or not being able to sleep. If this happens, especially at the beginning of treatment or after a change in dose, call your health care professional. You may get drowsy or dizzy. Do not drive, use machinery, or do anything that needs mental alertness until you know how this medicine affects you. Do not stand or sit up quickly, especially if you are an older patient. This reduces the risk of dizzy or fainting spells. Alcohol may interfere with the effect of this medicine. Avoid alcoholic drinks. Your mouth may get dry. Chewing sugarless gum or sucking hard candy, and drinking plenty of water may help. Contact your doctor if the problem does not go away or is severe. What side effects may I notice from receiving this medicine? Side effects that you should report to your doctor or health care professional as soon as possible: -allergic reactions like skin rash, itching or hives, swelling of the face, lips, or tongue -anxious -black, tarry stools -changes in vision -confusion -elevated mood, decreased need for sleep, racing thoughts, impulsive behavior -eye pain -fast, irregular heartbeat -feeling faint or lightheaded, falls -feeling agitated, angry, or irritable -hallucination, loss of contact with reality -loss of balance or coordination -loss of memory -painful or prolonged erections -restlessness, pacing, inability to keep still -seizures -stiff muscles -suicidal thoughts or other mood changes -trouble sleeping -unusual bleeding or bruising -unusually weak or tired -vomiting Side effects that usually do not require medical attention (report to your doctor or health care  professional if they continue or are bothersome): -change in appetite or weight -change in sex drive or performance -diarrhea -increased sweating -indigestion, nausea -tremors This list may not describe all possible side effects. Call your doctor for medical advice about side effects. You may report side effects to FDA at 1-800-FDA-1088. Where should I keep my medicine? Keep out of the reach of children. Store at room temperature between 15 and 30 degrees C (59 and 86 degrees F). Throw away any unused medicine after the expiration date. NOTE: This sheet is a summary. It may not cover all possible information. If you have questions about this medicine, talk to your doctor, pharmacist, or health care provider.  2018 Elsevier/Gold Standard (2016-04-19 14:17:49)  

## 2016-09-27 NOTE — Progress Notes (Signed)
26 y.o. G0P0000 Single Caucasian female here for annual exam.    Doing well on Camilla.  Feeling good emotionally on this.  Not having cycles on this pill. Does not forget. Infrequent sex.  No change in partners.  Increased stress at work for the last 6 months. Lots of responsibility. Boyfriend works at the same company. Difficulty letting go of worry when gets home. Denies depression and suicidal ideation. Has done counseling in the past and does not feel her condition is that severe. FH of depression and use of SSRIs.  No prior STD testing.   Going to [place] with friends.  UPT today - negative.   PCP:   None.  Patient's last menstrual period was 05/30/2016.     Period Pattern: (!) Irregular Menstrual Flow: Light Menstrual Control: Maxi pad Dysmenorrhea: None     Sexually active: Yes.    The current method of family planning is oral progesterone-only contraceptive.    Exercising: No.  exercise Smoker:  no  Health Maintenance: Pap:  None? History of abnormal Pap:  no MMG:  none Colonoscopy:  none BMD:   none TDaP:  2012 Gardasil:   no HIV: donated blood Hep C: donated blood Screening Labs:  Hb today: none, Urine today: none,  poct upt-neg   reports that she has never smoked. She has never used smokeless tobacco. She reports that she drinks about 1.2 - 2.4 oz of alcohol per week . She reports that she does not use drugs.  Past Medical History:  Diagnosis Date  . Depression    while on OCPs    Past Surgical History:  Procedure Laterality Date  . HYMENECTOMY N/A 09/05/2015   Procedure: HYMENECTOMY excision of hymenal remnant ;  Surgeon: [name], MD;  Location: [facility];  Service: Gynecology;  Laterality: N/A;  . NO PAST SURGERIES      Current Outpatient Prescriptions  Medication Sig Dispense Refill  . norethindrone (MICRONOR,CAMILA,ERRIN) 0.35 MG tablet Take 1 tablet (0.35 mg total) by mouth daily. 3 Package 2   No current  facility-administered medications for this visit.     Family History  Problem Relation Age of Onset  . Ulcerative colitis Paternal Grandfather   . Stroke Paternal Grandfather   . Cancer Other        colon ca  . Cancer Other        colon ca  . Diabetes Maternal Grandfather     ROS:  Pertinent items are noted in HPI.  Otherwise, a comprehensive ROS was negative.  Exam:   BP 110/72 (BP Location: Right Arm, Patient Position: Sitting, Cuff Size: Normal)   Pulse 78   Resp 16   Ht 5' 3.5" (1.613 m)   Wt 130 lb 12.8 oz (59.3 kg)   LMP 05/30/2016 Comment: Continuous Birth Control  BMI 22.81 kg/m     General appearance: alert, cooperative and appears stated age Head: Normocephalic, without obvious abnormality, atraumatic Neck: no adenopathy, supple, symmetrical, trachea midline and thyroid normal to inspection and palpation Lungs: clear to auscultation bilaterally Breasts: normal appearance, no masses or tenderness, No nipple retraction or dimpling, No nipple discharge or bleeding, No axillary or supraclavicular adenopathy Heart: regular rate and rhythm Abdomen: soft, non-tender; no masses, no organomegaly Extremities: extremities normal, atraumatic, no cyanosis or edema Skin: Skin color, texture, turgor normal. No rashes or lesions Lymph nodes: Cervical, supraclavicular, and axillary nodes normal. No abnormal inguinal nodes palpated Neurologic: Grossly normal  Pelvic: External genitalia:  no lesions              Urethra:  normal appearing urethra with no masses, tenderness or lesions              Bartholins and Skenes: normal                 Vagina: normal appearing vagina with normal color and discharge, no lesions              Cervix: no lesions              Pap taken: Yes.   Bimanual Exam:  Uterus:  normal size, contour, position, consistency, mobility, non-tender              Adnexa: no mass, fullness, tenderness               Chaperone was present for exam.  Assessment:    Well woman visit with normal exam. Status post hymenectomy.  STD screening. Situational stress.  Plan: Mammogram screening discussed. Recommended self breast awareness. Pap and HR HPV as above. Guidelines for Calcium, Vitamin D, regular exercise program including cardiovascular and weight bearing exercise. GC/CT and trichomonas.  Will do serum STD testing when she returns for her recheck in 6 weeks.  Follow up annually and prn.   Additional counseling given.  Yes.  . ___15____ minutes face to face time of which over 50% was spent in counseling regarding situational stress.  Will start Zoloft 50 mg daily.  Discussed benefits, risks, and side effects.  Serotonin syndrome discussed. She declines counseling at this time, but will consider this.  After visit summary provided.

## 2016-10-03 ENCOUNTER — Encounter: Payer: Self-pay | Admitting: Obstetrics and Gynecology

## 2016-10-03 ENCOUNTER — Telehealth: Payer: Self-pay | Admitting: *Deleted

## 2016-10-03 LAB — CYTOLOGY - PAP
CHLAMYDIA, DNA PROBE: NEGATIVE
NEISSERIA GONORRHEA: NEGATIVE
Trichomonas: NEGATIVE

## 2016-10-03 NOTE — Telephone Encounter (Addendum)
-----   Message from [name], MD sent at 10/03/2016 12:34 PM EDT ----- Her testing for GC/CT and trichomonas were all negative.  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  These cells are abnormal, but it is not cancer.  At her current age, the guidelines recommend repeating a pap in 12 months.  There is no further evaluation or treatment because this is likely to resolve on its own.  Please enter recall 08.

## 2016-10-03 NOTE — Telephone Encounter (Signed)
08 recall placed.  

## 2016-10-03 NOTE — Telephone Encounter (Signed)
Spoke with patient, advised of results and recommendations as seen below per Dr. Edward JollySilva. Patient asking cause of LGSIL, advised abnormal cells at her age most commonly related to HPV, will likely resolve on its own, important to f/u with AEX as recommended. Patient ask how common is hpv? Advised very common, estimated 80 percent of SA people contract hpv. Patient plans to further discuss results at f/u visit on 11/01/16 with Dr. Edward JollySilva. Patient verbalizes understanding and is agreeable.     Routing to provider for final review. Patient is agreeable to disposition. Will close encounter.

## 2016-11-01 ENCOUNTER — Encounter: Payer: Self-pay | Admitting: Obstetrics and Gynecology

## 2016-11-01 ENCOUNTER — Ambulatory Visit (INDEPENDENT_AMBULATORY_CARE_PROVIDER_SITE_OTHER): Payer: 59 | Admitting: Obstetrics and Gynecology

## 2016-11-01 VITALS — BP 118/60 | HR 76 | Resp 16 | Wt 131.0 lb

## 2016-11-01 DIAGNOSIS — Z113 Encounter for screening for infections with a predominantly sexual mode of transmission: Secondary | ICD-10-CM | POA: Diagnosis not present

## 2016-11-01 DIAGNOSIS — F439 Reaction to severe stress, unspecified: Secondary | ICD-10-CM

## 2016-11-01 DIAGNOSIS — R87612 Low grade squamous intraepithelial lesion on cytologic smear of cervix (LGSIL): Secondary | ICD-10-CM

## 2016-11-01 MED ORDER — SERTRALINE HCL 50 MG PO TABS: 50.0000 mg | ORAL_TABLET | Freq: Every day | ORAL | 3 refills | Status: DC

## 2016-11-01 NOTE — Patient Instructions (Signed)

## 2016-11-01 NOTE — Progress Notes (Signed)
GYNECOLOGY  VISIT   HPI: 26 y.o.   Single  Caucasian  female   G0P0000 with No LMP recorded. Patient is not currently having periods (Reason: Oral contraceptives).   here for   6 week recheck.  Started on Zoloft 50 mg at visit on 09/27/16 for situational stress.  Feels a little more tired on the Zoloft.  Also does not sleep enough. Still has some work stress.  Fels more herself now.  More calm and upbeat.  No change in libido. No change in weight.  Pap on 09/27/16 showed LGSIL.  This was her first pap smear.   GYNECOLOGIC HISTORY: No LMP recorded. Patient is not currently having periods (Reason: Oral contraceptives). Contraception:  OCP Menopausal hormone therapy:  n/a Last mammogram:  n/a Last pap smear:   09/27/16 Pap showing LGSIL        OB History    Gravida Para Term Preterm AB Living   0 0 0 0 0 0   SAB TAB Ectopic Multiple Live Births   0 0 0 0           There are no active problems to display for this patient.   Past Medical History:  Diagnosis Date  . Depression    while on OCPs  . 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 HYMENECTOMY N/A 09/05/2015   Procedure: HYMENECTOMY excision of hymenal remnant ;  Surgeon: [name], MD;  Location: [facility];  Service: Gynecology;  Laterality: N/A;  . NO PAST SURGERIES      Current Outpatient Prescriptions  Medication Sig Dispense Refill  . norethindrone (MICRONOR,CAMILA,ERRIN) 0.35 MG tablet Take 1 tablet (0.35 mg total) by mouth daily. 3 Package 3  . sertraline (ZOLOFT) 50 MG tablet Take 1 tablet (50 mg total) by mouth daily. 90 tablet 3   No current facility-administered medications for this visit.      ALLERGIES: Patient has no known allergies.  Family History  Problem Relation Age of Onset  . Ulcerative colitis Paternal Grandfather   . Stroke Paternal Grandfather   . Cancer Other        colon ca  . Cancer Other        colon ca  .  Diabetes Maternal Grandfather     Social History   Social History  . Marital status: Single    Spouse name: N/A  . Number of children: N/A  . Years of education: N/A   Occupational History  . Not on file.   Social History Main Topics  . Smoking status: Never Smoker  . Smokeless tobacco: Never Used  . Alcohol use 1.2 - 2.4 oz/week    2 - 4 Standard drinks or equivalent per week     Comment: 2-5 drinks per week  . Drug use: No  . Sexual activity: Yes    Partners: Male    Birth control/ protection: Pill   Other Topics Concern  . Not on file   Social History Narrative  . No narrative on file    ROS:  Pertinent items are noted in HPI.  PHYSICAL EXAMINATION:    BP 118/60 (BP Location: Right Arm, Patient Position: Sitting, Cuff Size: Normal)   Pulse 76   Resp 16   Wt 131 lb (59.4 kg)   BMI 22.84 kg/m     General appearance: alert, cooperative and appears stated age.    ASSESSMENT  Situational stress.  Doing well on Zoloft.  LGSIL pap.   PLAN  Continue Zoloft in current dosage.  Discussion of abnormal paps, HPV, colposcopy, and LEEP. Pap in one year.  Serum STD screening.  Follow up for annual exam and prn.   An After Visit Summary was printed and given to the patient.  ___15___ minutes face to face time of which over 50% was spent in counseling.

## 2016-11-02 LAB — HEPATITIS C ANTIBODY

## 2016-11-02 LAB — HEP, RPR, HIV PANEL
HEP B S AG: NEGATIVE
HIV SCREEN 4TH GENERATION: NONREACTIVE
RPR: NONREACTIVE

## 2016-11-03 ENCOUNTER — Encounter: Payer: Self-pay | Admitting: Obstetrics and Gynecology

## 2017-01-27 ENCOUNTER — Encounter: Payer: Self-pay | Admitting: Obstetrics and Gynecology

## 2017-01-28 ENCOUNTER — Telehealth: Payer: Self-pay | Admitting: *Deleted

## 2017-01-28 NOTE — Telephone Encounter (Signed)
See telephone encounter dated 01/28/17. 

## 2017-01-28 NOTE — Telephone Encounter (Signed)
Spoke with patient. Has been off of Zoloft for 4 days now, feels stress and anger starting to return, would like recommendations for alternative medication or dose change. Sleepiness was increasing with Zoloft, felt the medication did help with mood.   Recommended OV with Dr. Edward Jolly for further evaluation, patient request Friday afternoon appt. Scheduled for 10/5 at 3:30pm. Advised patient will review with Dr. Edward Jolly and return call with any additional recommendations, patient is agreeable.   Last OV 11/01/16  Routing to provider for final review. Patient is agreeable to disposition. Will close encounter.     From [name] To Patton Salles, MD Sent 01/27/2017 8:51 PM  Hi Dr. Edward Jolly,   I wanted to check in with an update on my prescription. I was put on Zoloft a couple months ago--its definitely working well as far as helping with the stress & anger caused by my job. However, its increasingly been getting worse and worse as far as causing sleepiness. I've been feeling like a zombie during the week and then getting too much sleep on the weekends. This Saturday, I slept for 17 hours. I decided to stop taking it; after missing three doses, I feel like I can already feel my high stress & struggle to manage it returning. I cried for 30 minutes in the bathroom at work and raised my voice at several coworkers, which I haven't done since I started taking Zoloft. I definitely want to be on an anti-depressant to help me cope, but the extreme exhaustion caused by this dosage is interrupting my normal life. Can you change my prescription accordingly? I'd really like to target an antidepressant that won't cause weight gain or loss of libido.   Thank you!   Carolyn Jefferson

## 2017-01-31 ENCOUNTER — Ambulatory Visit (INDEPENDENT_AMBULATORY_CARE_PROVIDER_SITE_OTHER): Payer: 59 | Admitting: Obstetrics and Gynecology

## 2017-01-31 ENCOUNTER — Encounter: Payer: Self-pay | Admitting: Obstetrics and Gynecology

## 2017-01-31 VITALS — BP 100/58 | HR 66 | Ht 63.5 in | Wt 127.0 lb

## 2017-01-31 DIAGNOSIS — F439 Reaction to severe stress, unspecified: Secondary | ICD-10-CM | POA: Diagnosis not present

## 2017-01-31 MED ORDER — BUPROPION HCL ER (XL) 150 MG PO TB24: 150.0000 mg | ORAL_TABLET | Freq: Every day | ORAL | 2 refills | Status: DC

## 2017-01-31 NOTE — Patient Instructions (Signed)
Bupropion extended-release tablets (Depression/Mood Disorders) What is this medicine? BUPROPION (byoo PROE pee on) is used to treat depression. This medicine may be used for other purposes; ask your health care provider or pharmacist if you have questions. COMMON BRAND NAME(S): Aplenzin, Budeprion XL, Forfivo XL, Wellbutrin XL What should I tell my health care provider before I take this medicine? They need to know if you have any of these conditions: -an eating disorder, such as anorexia or bulimia -bipolar disorder or psychosis -diabetes or high blood sugar, treated with medication -glaucoma -head injury or brain tumor -heart disease, previous heart attack, or irregular heart beat -high blood pressure -kidney or liver disease -seizures (convulsions) -suicidal thoughts or a previous suicide attempt -Tourette's syndrome -weight loss -an unusual or allergic reaction to bupropion, other medicines, foods, dyes, or preservatives -breast-feeding -pregnant or trying to become pregnant How should I use this medicine? Take this medicine by mouth with a glass of water. Follow the directions on the prescription label. You can take it with or without food. If it upsets your stomach, take it with food. Do not crush, chew, or cut these tablets. This medicine is taken once daily at the same time each day. Do not take your medicine more often than directed. Do not stop taking this medicine suddenly except upon the advice of your doctor. Stopping this medicine too quickly may cause serious side effects or your condition may worsen. A special MedGuide will be given to you by the pharmacist with each prescription and refill. Be sure to read this information carefully each time. Talk to your pediatrician regarding the use of this medicine in children. Special care may be needed. Overdosage: If you think you have taken too much of this medicine contact a poison control center or emergency room at once. NOTE:  This medicine is only for you. Do not share this medicine with others. What if I miss a dose? If you miss a dose, skip the missed dose and take your next tablet at the regular time. Do not take double or extra doses. What may interact with this medicine? Do not take this medicine with any of the following medications: -linezolid -MAOIs like Azilect, Carbex, Eldepryl, Marplan, Nardil, and Parnate -methylene blue (injected into a vein) -other medicines that contain bupropion like Zyban This medicine may also interact with the following medications: -alcohol -certain medicines for anxiety or sleep -certain medicines for blood pressure like metoprolol, propranolol -certain medicines for depression or psychotic disturbances -certain medicines for HIV or AIDS like efavirenz, lopinavir, nelfinavir, ritonavir -certain medicines for irregular heart beat like propafenone, flecainide -certain medicines for Parkinson's disease like amantadine, levodopa -certain medicines for seizures like carbamazepine, phenytoin, phenobarbital -cimetidine -clopidogrel -cyclophosphamide -digoxin -furazolidone -isoniazid -nicotine -orphenadrine -procarbazine -steroid medicines like prednisone or cortisone -stimulant medicines for attention disorders, weight loss, or to stay awake -tamoxifen -theophylline -thiotepa -ticlopidine -tramadol -warfarin This list may not describe all possible interactions. Give your health care provider a list of all the medicines, herbs, non-prescription drugs, or dietary supplements you use. Also tell them if you smoke, drink alcohol, or use illegal drugs. Some items may interact with your medicine. What should I watch for while using this medicine? Tell your doctor if your symptoms do not get better or if they get worse. Visit your doctor or health care professional for regular checks on your progress. Because it may take several weeks to see the full effects of this medicine, it  is important to continue your treatment as   prescribed by your doctor. Patients and their families should watch out for new or worsening thoughts of suicide or depression. Also watch out for sudden changes in feelings such as feeling anxious, agitated, panicky, irritable, hostile, aggressive, impulsive, severely restless, overly excited and hyperactive, or not being able to sleep. If this happens, especially at the beginning of treatment or after a change in dose, call your health care professional. Avoid alcoholic drinks while taking this medicine. Drinking large amounts of alcoholic beverages, using sleeping or anxiety medicines, or quickly stopping the use of these agents while taking this medicine may increase your risk for a seizure. Do not drive or use heavy machinery until you know how this medicine affects you. This medicine can impair your ability to perform these tasks. Do not take this medicine close to bedtime. It may prevent you from sleeping. Your mouth may get dry. Chewing sugarless gum or sucking hard candy, and drinking plenty of water may help. Contact your doctor if the problem does not go away or is severe. The tablet shell for some brands of this medicine does not dissolve. This is normal. The tablet shell may appear whole in the stool. This is not a cause for concern. What side effects may I notice from receiving this medicine? Side effects that you should report to your doctor or health care professional as soon as possible: -allergic reactions like skin rash, itching or hives, swelling of the face, lips, or tongue -breathing problems -changes in vision -confusion -elevated mood, decreased need for sleep, racing thoughts, impulsive behavior -fast or irregular heartbeat -hallucinations, loss of contact with reality -increased blood pressure -redness, blistering, peeling or loosening of the skin, including inside the mouth -seizures -suicidal thoughts or other mood  changes -unusually weak or tired -vomiting Side effects that usually do not require medical attention (report to your doctor or health care professional if they continue or are bothersome): -constipation -headache -loss of appetite -nausea -tremors -weight loss This list may not describe all possible side effects. Call your doctor for medical advice about side effects. You may report side effects to FDA at 1-800-FDA-1088. Where should I keep my medicine? Keep out of the reach of children. Store at room temperature between 15 and 30 degrees C (59 and 86 degrees F). Throw away any unused medicine after the expiration date. NOTE: This sheet is a summary. It may not cover all possible information. If you have questions about this medicine, talk to your doctor, pharmacist, or health care provider.  2018 Elsevier/Gold Standard (2015-10-06 13:55:13)  

## 2017-01-31 NOTE — Progress Notes (Signed)
GYNECOLOGY  VISIT   HPI: 26 y.o.   Single  Caucasian  female   G0P0000 with Patient's last menstrual period was 11/25/2016 (approximate).   here for consult to discuss medication alternative. Patient was doing better with stress and anger on Zoloft but it was making her sleepy--she stopped on 01-24-17. She wants to discuss other options.     Zoloft improved her mood and did not affect her libido.   Feels dizziness since stopping Zoloft.  States this is not severe.  Work is a negative environment still. Thinking about making a change next year with a new job.  GYNECOLOGIC HISTORY: Patient's last menstrual period was 11/25/2016 (approximate). Contraception: OCPs--Micronor--takes continuously Menopausal hormone therapy:  n/a Last mammogram:  n/a Last pap smear:  09-27-16 LGSIL        OB History    Gravida Para Term Preterm AB Living   0 0 0 0 0 0   SAB TAB Ectopic Multiple Live Births   0 0 0 0           There are no active problems to display for this patient.   Past Medical History:  Diagnosis Date  . Depression    while on OCPs  . 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 Situational stress 2018    Past Surgical History:  Procedure Laterality Date  . HYMENECTOMY N/A 09/05/2015   Procedure: HYMENECTOMY excision of hymenal remnant ;  Surgeon: Patton Salles, MD;  Location: [facility];  Service: Gynecology;  Laterality: N/A;  . NO PAST SURGERIES      Current Outpatient Prescriptions  Medication Sig Dispense Refill  . norethindrone (MICRONOR,CAMILA,ERRIN) 0.35 MG tablet Take 1 tablet (0.35 mg total) by mouth daily. 3 Package 3  . sertraline (ZOLOFT) 50 MG tablet Take 1 tablet (50 mg total) by mouth daily. (Patient not taking: Reported on 01/31/2017) 90 tablet 3   No current facility-administered medications for this visit.      ALLERGIES: Patient has no known allergies.  Family History  Problem Relation Age of Onset  . Ulcerative colitis Paternal Grandfather    . Stroke Paternal Grandfather   . Cancer Other        colon ca  . Cancer Other        colon ca  . Diabetes Maternal Grandfather     Social History   Social History  . Marital status: Single    Spouse name: N/A  . Number of children: N/A  . Years of education: N/A   Occupational History  . Not on file.   Social History Main Topics  . Smoking status: Never Smoker  . Smokeless tobacco: Never Used  . Alcohol use 1.2 - 2.4 oz/week    2 - 4 Standard drinks or equivalent per week     Comment: 2-5 drinks per week  . Drug use: No  . Sexual activity: Yes    Partners: Male    Birth control/ protection: Pill     Comment: Micronor--continuously   Other Topics Concern  . Not on file   Social History Narrative  . No narrative on file    ROS:  Pertinent items are noted in HPI.  PHYSICAL EXAMINATION:    BP (!) 100/58 (BP Location: Right Arm, Patient Position: Sitting, Cuff Size: Normal)   Pulse 66   Ht 5' 3.5" (1.613 m)   Wt 127 lb (57.6 kg)   LMP 11/25/2016 (Approximate)   BMI 22.14 kg/m  General appearance: alert, cooperative and appears stated age  ASSESSMENT  Situational stress.  Fatigue with Zoloft.  Dizziness is a side effect of discontinuation of the Zoloft.   PLAN  We discussed situational stress and the effect it is having on the quality of her life.  Start Wellbutrin XL 150 mg daily.  Discussed potential side effects - major and minor.  Written information on the medication.  She will follow up in 6 weeks, sooner as needed.   An After Visit Summary was printed and given to the patient.  ___15___ minutes face to face time of which over 50% was spent in counseling.

## 2017-03-14 ENCOUNTER — Ambulatory Visit: Payer: 59 | Admitting: Obstetrics and Gynecology

## 2017-03-14 ENCOUNTER — Encounter: Payer: Self-pay | Admitting: Obstetrics and Gynecology

## 2017-03-14 ENCOUNTER — Telehealth: Payer: Self-pay | Admitting: Obstetrics and Gynecology

## 2017-03-14 ENCOUNTER — Other Ambulatory Visit: Payer: Self-pay

## 2017-03-14 VITALS — BP 100/58 | HR 78 | Resp 14 | Wt 127.5 lb

## 2017-03-14 DIAGNOSIS — N912 Amenorrhea, unspecified: Secondary | ICD-10-CM | POA: Diagnosis not present

## 2017-03-14 DIAGNOSIS — F439 Reaction to severe stress, unspecified: Secondary | ICD-10-CM

## 2017-03-14 MED ORDER — BUPROPION HCL ER (XL) 150 MG PO TB24: 150.0000 mg | ORAL_TABLET | Freq: Every day | ORAL | 2 refills | Status: DC

## 2017-03-14 NOTE — Telephone Encounter (Signed)
I was going to give her Carolyn Jefferson's name at [facility].

## 2017-03-14 NOTE — Telephone Encounter (Signed)
Dr. Edward JollySilva,  MyChart message to patient. Any additional recommendations?

## 2017-03-14 NOTE — Telephone Encounter (Signed)
Patient was seen today and was supposed to the name of a therapist from Dr.Silva. Patent said you may send her a MyChart message with the name.

## 2017-03-14 NOTE — Progress Notes (Signed)
GYNECOLOGY  VISIT   HPI: 26 y.o.   Single  Caucasian  female   G0P0000 with Patient's last menstrual period was 11/25/2016.   here for 6 week follow up after starting Wellbutrin XL 150 mg. Feels like she is doing OK. Not feeling as tired on the Wellbutrin as she did on the Zoloft. Still has stress at work.   No menses on Micronor OCPs.  She did miss a pill.  Asked for UPT today:  Neg.  GYNECOLOGIC HISTORY: Patient's last menstrual period was 11/25/2016. Contraception:  Micronor--continuously Menopausal hormone therapy:  n/a Last mammogram:  n/a Last pap smear: 09-27-16 LGSIL        OB History    Gravida Para Term Preterm AB Living   0 0 0 0 0 0   SAB TAB Ectopic Multiple Live Births   0 0 0 0           There are no active problems to display for this patient.   Past Medical History:  Diagnosis Date  . Depression    while on OCPs  . 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 Situational stress 2018    Past Surgical History:  Procedure Laterality Date  . HYMENECTOMY excision of hymenal remnant N/A 09/05/2015   Performed by Patton SallesAmundson C Silva, Brook E, [facility]  . NO PAST SURGERIES      Current Outpatient Medications  Medication Sig Dispense Refill  . buPROPion (WELLBUTRIN XL) 150 MG 24 hr tablet Take 1 tablet (150 mg total) by mouth daily. 30 tablet 2  . norethindrone (MICRONOR,CAMILA,ERRIN) 0.35 MG tablet Take 1 tablet (0.35 mg total) by mouth daily. 3 Package 3   No current facility-administered medications for this visit.      ALLERGIES: Patient has no known allergies.  Family History  Problem Relation Age of Onset  . Ulcerative colitis Paternal Grandfather   . Stroke Paternal Grandfather   . Cancer Other        colon ca  . Cancer Other        colon ca  . Diabetes Maternal Grandfather     Social History   Socioeconomic History  . Marital status: Single    Spouse name: Not on file  . Number of children: Not on file  . Years of education: Not on  file  . Highest education level: Not on file  Social Needs  . Financial resource strain: Not on file  . Food insecurity - worry: Not on file  . Food insecurity - inability: Not on file  . Transportation needs - medical: Not on file  . Transportation needs - non-medical: Not on file  Occupational History  . Not on file  Tobacco Use  . Smoking status: Never Smoker  . Smokeless tobacco: Never Used  Substance and Sexual Activity  . Alcohol use: Yes    Alcohol/week: 1.2 - 2.4 oz    Types: 2 - 4 Standard drinks or equivalent per week    Comment: 2-5 drinks per week  . Drug use: No  . Sexual activity: Yes    Partners: Male    Birth control/protection: Pill    Comment: Micronor--continuously  Other Topics Concern  . Not on file  Social History Narrative  . Not on file    ROS:  Pertinent items are noted in HPI.  PHYSICAL EXAMINATION:    BP (!) 100/58 (BP Location: Left Arm, Patient Position: Sitting, Cuff Size: Normal)   Pulse 78  Resp 14   Wt 127 lb 8 oz (57.8 kg)   LMP 11/25/2016   BMI 22.23 kg/m     General appearance: alert, cooperative and appears stated age   ASSESSMENT  Situational stress.   Doing well on Wellbutrin XL 150 mg.  Missed Micronor OCP.   PLAN  Continue Wellbutrin XL at current dosage.  We discussed adding a psychologist to her team and she is open to this.  She will use back up for at least one week.  I suggested setting a cell phone for her to remember to take her OCP regularly. Follow up prn.    An After Visit Summary was printed and given to the patient.  __15___ minutes face to face time of which over 50% was spent in counseling.

## 2017-03-17 NOTE — Telephone Encounter (Signed)
Left detailed message, ok per current dpr. Advised as seen below per Dr. Edward JollySilva. [facility] [phone]. Return call to office at [phone] with any additional questions.   Routing to provider for final review. Will close encounter.

## 2017-07-24 ENCOUNTER — Encounter: Payer: Self-pay | Admitting: Obstetrics and Gynecology

## 2017-07-25 ENCOUNTER — Telehealth: Payer: Self-pay | Admitting: Obstetrics and Gynecology

## 2017-07-25 MED ORDER — BUPROPION HCL ER (XL) 300 MG PO TB24: 300.0000 mg | ORAL_TABLET | Freq: Every day | ORAL | 0 refills | Status: DC

## 2017-07-25 NOTE — Telephone Encounter (Signed)
Routing to Dr.Silva for review and advise of medication dose change.

## 2017-07-25 NOTE — Telephone Encounter (Signed)
Ok to increase Wellbutrin XL to 300 mg daily until annual exam is due.  She can report back how she is doing on the increased dosage.

## 2017-07-25 NOTE — Telephone Encounter (Signed)
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 I've had a good reaction to the buPROPion you prescribed. I know when we last met we discussed it being at the lowest dosage. After some months on it and monitoring how I react, I think a slight increase in dosage would be beneficial in helping to handle/cope with work stress. I also think I would react fine to that, since the buPROPion I'm on now has had a very mild affect.      Best regards,  [employer]

## 2017-07-28 ENCOUNTER — Telehealth: Payer: Self-pay | Admitting: Obstetrics and Gynecology

## 2017-07-28 NOTE — Telephone Encounter (Signed)
Message   ----- Message from Mychart, Generic sent at 07/25/2017 10:56 PM EDT -----    Thank you, Yvonna AlanisKaitlyn!   ----- Message -----  From: Nurse Richarda OverlieKaitlyn E S  Sent: 07/25/2017 12:26 PM EDT  To: Santiago BumpersHaleigh Evans  Subject: Medication  Carolyn Jefferson,    Dr.Silva has reviewed your MyChart message and is okay with you increasing your Bupropion to 300 mg daily. [facility] [address] with enough to get you through until you are due to be seen again in the office. Please contact the office to give Dr.Silva and update on how you're doing on the increased medication dosage.    Sincerely,    Nolen MuKaitlyn Sprague, RN

## 2017-10-03 ENCOUNTER — Ambulatory Visit: Payer: 59 | Admitting: Obstetrics and Gynecology

## 2017-10-09 ENCOUNTER — Other Ambulatory Visit: Payer: Self-pay | Admitting: Obstetrics and Gynecology

## 2017-10-09 MED ORDER — NORETHINDRONE 0.35 MG PO TABS: 1.0000 | ORAL_TABLET | Freq: Every day | ORAL | 0 refills | Status: DC

## 2017-10-09 NOTE — Telephone Encounter (Signed)
Pharmacy called and stated that the insurance will only cover a 3 month supply for the birth control that was sent over for this patient.

## 2017-10-09 NOTE — Telephone Encounter (Signed)
Medication refill request: OCP  Last AEX:  09-27-16  Next AEX: 10-10-17  Last MMG (if hormonal medication request): N/A Refill authorized: please advise

## 2017-10-09 NOTE — Telephone Encounter (Signed)
Patient requesting OCP to be sent in for a 90 day supply due to insurance purposes.

## 2017-10-10 ENCOUNTER — Ambulatory Visit (INDEPENDENT_AMBULATORY_CARE_PROVIDER_SITE_OTHER): Payer: 59 | Admitting: Obstetrics and Gynecology

## 2017-10-10 ENCOUNTER — Encounter: Payer: Self-pay | Admitting: Obstetrics and Gynecology

## 2017-10-10 ENCOUNTER — Other Ambulatory Visit: Payer: Self-pay

## 2017-10-10 ENCOUNTER — Other Ambulatory Visit (HOSPITAL_COMMUNITY)
Admission: RE | Admit: 2017-10-10 | Discharge: 2017-10-10 | Disposition: A | Discharge status: Home / Self Care | Payer: 59 | Source: Ambulatory Visit | Attending: Obstetrics and Gynecology | Admitting: Obstetrics and Gynecology

## 2017-10-10 VITALS — BP 94/62 | HR 88 | Resp 16 | Ht 62.75 in | Wt 121.0 lb

## 2017-10-10 DIAGNOSIS — N926 Irregular menstruation, unspecified: Secondary | ICD-10-CM | POA: Diagnosis not present

## 2017-10-10 DIAGNOSIS — Z7185 Encounter for immunization safety counseling: Secondary | ICD-10-CM

## 2017-10-10 DIAGNOSIS — Z01419 Encounter for gynecological examination (general) (routine) without abnormal findings: Secondary | ICD-10-CM | POA: Diagnosis not present

## 2017-10-10 DIAGNOSIS — Z7189 Other specified counseling: Secondary | ICD-10-CM | POA: Diagnosis not present

## 2017-10-10 DIAGNOSIS — Z23 Encounter for immunization: Secondary | ICD-10-CM

## 2017-10-10 DIAGNOSIS — Z113 Encounter for screening for infections with a predominantly sexual mode of transmission: Secondary | ICD-10-CM | POA: Diagnosis not present

## 2017-10-10 LAB — POCT URINE PREGNANCY: Preg Test, Ur: NEGATIVE

## 2017-10-10 MED ORDER — NORETHINDRONE 0.35 MG PO TABS: 1.0000 | ORAL_TABLET | Freq: Every day | ORAL | 2 refills | Status: DC

## 2017-10-10 MED ORDER — BUPROPION HCL ER (XL) 300 MG PO TB24: 300.0000 mg | ORAL_TABLET | Freq: Every day | ORAL | 3 refills | Status: DC

## 2017-10-10 NOTE — Patient Instructions (Addendum)
EXERCISE AND DIET:  We recommended that you start or continue a regular exercise program for good health. Regular exercise means any activity that makes your heart beat faster and makes you sweat.  We recommend exercising at least 30 minutes per day at least 3 days a week, preferably 4 or 5.  We also recommend a diet low in fat and sugar.  Inactivity, poor dietary choices and obesity can cause diabetes, heart attack, stroke, and kidney damage, among others.    ALCOHOL AND SMOKING:  Women should limit their alcohol intake to no more than 7 drinks/beers/glasses of wine (combined, not each!) per week. Moderation of alcohol intake to this level decreases your risk of breast cancer and liver damage. And of course, no recreational drugs are part of a healthy lifestyle.  And absolutely no smoking or even second hand smoke. Most people know smoking can cause heart and lung diseases, but did you know it also contributes to weakening of your bones? Aging of your skin?  Yellowing of your teeth and nails?  CALCIUM AND VITAMIN D:  Adequate intake of calcium and Vitamin D are recommended.  The recommendations for exact amounts of these supplements seem to change often, but generally speaking 600 mg of calcium (either carbonate or citrate) and 800 units of Vitamin D per day seems prudent. Certain women may benefit from higher intake of Vitamin D.  If you are among these women, your doctor will have told you during your visit.    PAP SMEARS:  Pap smears, to check for cervical cancer or precancers,  have traditionally been done yearly, although recent scientific advances have shown that most women can have pap smears less often.  However, every woman still should have a physical exam from her gynecologist every year. It will include a breast check, inspection of the vulva and vagina to check for abnormal growths or skin changes, a visual exam of the cervix, and then an exam to evaluate the size and shape of the uterus and  ovaries.  And after 25 years of age, a rectal exam is indicated to check for rectal cancers. We will also provide age appropriate advice regarding health maintenance, like when you should have certain vaccines, screening for sexually transmitted diseases, bone density testing, colonoscopy, mammograms, etc.   MAMMOGRAMS:  All women over 40 years old should have a yearly mammogram. Many facilities now offer a "3D" mammogram, which may cost around $50 extra out of pocket. If possible,  we recommend you accept the option to have the 3D mammogram performed.  It both reduces the number of women who will be called back for extra views which then turn out to be normal, and it is better than the routine mammogram at detecting truly abnormal areas.    COLONOSCOPY:  Colonoscopy to screen for colon cancer is recommended for all women at age 50.  We know, you hate the idea of the prep.  We agree, BUT, having colon cancer and not knowing it is worse!!  Colon cancer so often starts as a polyp that can be seen and removed at colonscopy, which can quite literally save your life!  And if your first colonoscopy is normal and you have no family history of colon cancer, most women don't have to have it again for 10 years.  Once every ten years, you can do something that may end up saving your life, right?  We will be happy to help you get it scheduled when you are ready.    Be sure to check your insurance coverage so you understand how much it will cost.  It may be covered as a preventative service at no cost, but you should check your particular policy.     Tremor A tremor is trembling or shaking that you cannot control. Most tremors affect the hands or arms. Tremors can also affect the head, vocal cords, face, and other parts of the body. There are many types of tremors. Common types include:  Essential tremor. These usually occur in people over the age of 25. It may run in families and can happen in otherwise healthy  people.  Resting tremor. These occur when the muscles are at rest, such as when your hands are resting in your lap. People with Parkinson disease often have resting tremors.  Postural tremor. These occur when you try to hold a pose, such as keeping your hands outstretched.  Kinetic tremor. These occur during purposeful movement, such as trying to touch a finger to your nose.  Task-specific tremor. These may occur when you perform tasks such as handwriting, speaking, or standing.  Psychogenic tremor. These dramatically lessen or disappear when you are distracted. They can happen in people of all ages.  Some types of tremors have no known cause. Tremors can also be a symptom of nervous system problems (neurological disorders) that may occur with aging. Some tremors go away with treatment while others do not. Follow these instructions at home: Watch your tremor for any changes. The following actions may help to lessen any discomfort you are feeling:  Take medicines only as directed by your health care provider.  Limit alcohol intake to no more than 1 drink per day for nonpregnant women and 2 drinks per day for men. One drink equals 12 oz of beer, 5 oz of wine, or 1 oz of hard liquor.  Do not use any tobacco products, including cigarettes, chewing tobacco, or electronic cigarettes. If you need help quitting, ask your health care provider.  Avoid extreme heat or cold.  Limit the amount of caffeine you consumeas directed by your health care provider.  Try to get 8 hours of sleep each night.  Find ways to manage your stress, such as meditation or yoga.  Keep all follow-up visits as directed by your health care provider. This is important.  Contact a health care provider if:  You start having a tremor after starting a new medicine.  You have tremor with other symptoms such as: ? Numbness. ? Tingling. ? Pain. ? Weakness.  Your tremor gets worse.  Your tremor interferes with your  day-to-day life. This information is not intended to replace advice given to you by your health care provider. Make sure you discuss any questions you have with your health care provider. Document Released: 04/05/2002 Document Revised: 12/17/2015 Document Reviewed: 10/11/2013 Elsevier Interactive Patient Education  [employer]2018 Elsevier Inc.

## 2017-10-10 NOTE — Progress Notes (Signed)
27 y.o. G50P0000 Single Caucasian female here for annual exam. Patient states that she has had a period started 09/30/17. Previous cycle was July 2018. Patient requests UPT. UPT - negative.  Noticed some warts on her hands and face.  Feels like her hands are shaky.  Occur with moving them or reaching for something.  No weakness or numbness.   On Wellbutrin for situational stress.  Does feel tried but she is choosing to sleep less.  Moved in with boyfriend one month ago.   PCP: No PCP     Patient's last menstrual period was 09/30/2017.           Sexually active: Yes.    The current method of family planning is oral progesterone-only contraceptive.    Exercising: No.  The patient does not participate in regular exercise at present. Smoker:  no  Health Maintenance: Pap:  09/27/16 LGSIL History of abnormal Pap:  no TDaP:  2012 Gardasil:   no HIV: donated blood  Hep C: donated blood  Screening Labs:  Discuss today   reports that she has never smoked. She has never used smokeless tobacco. She reports that she drinks about 1.2 - 2.4 oz of alcohol per week. She reports that she does not use drugs.  Past Medical History:  Diagnosis Date  . Depression    while on OCPs  . 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 Situational stress 2018    Past Surgical History:  Procedure Laterality Date  . HYMENECTOMY N/A 09/05/2015   Procedure: HYMENECTOMY excision of hymenal remnant ;  Surgeon: Patton Salles, MD;  Location: [facility];  Service: Gynecology;  Laterality: N/A;  . NO PAST SURGERIES      Current Outpatient Medications  Medication Sig Dispense Refill  . buPROPion (WELLBUTRIN XL) 300 MG 24 hr tablet Take 1 tablet (300 mg total) by mouth daily. 90 tablet 0  . norethindrone (MICRONOR,CAMILA,ERRIN) 0.35 MG tablet Take 1 tablet (0.35 mg total) by mouth daily. 84 tablet 0   No current facility-administered medications for this visit.     Family History  Problem Relation Age  of Onset  . Ulcerative colitis Paternal Grandfather   . Stroke Paternal Grandfather   . Cancer Other        colon ca  . Cancer Other        colon ca  . Diabetes Maternal Grandfather     Review of Systems  Constitutional: Negative.   HENT: Negative.   Eyes: Negative.   Respiratory: Negative.   Cardiovascular: Negative.   Gastrointestinal: Negative.   Endocrine: Negative.   Genitourinary:       Menstrual cycle changes  Musculoskeletal: Negative.   Skin: Negative.   Allergic/Immunologic: Negative.   Neurological: Negative.   Hematological: Negative.   Psychiatric/Behavioral: Negative.     Exam:   BP 110/68 (BP Location: Right Arm, Patient Position: Sitting, Cuff Size: Normal)   Pulse 88   Resp 16   Ht 5' 2.75" (1.594 m)   Wt 121 lb (54.9 kg)   LMP 09/30/2017   BMI 21.61 kg/m     General appearance: alert, cooperative and appears stated age Head: Normocephalic, without obvious abnormality, atraumatic Neck: no adenopathy, supple, symmetrical, trachea midline and thyroid normal to inspection and palpation Lungs: clear to auscultation bilaterally Breasts: normal appearance, no masses or tenderness, No nipple retraction or dimpling, No nipple discharge or bleeding, No axillary or supraclavicular adenopathy Heart: regular rate and rhythm Abdomen: soft, non-tender;  no masses, no organomegaly Extremities: extremities normal, atraumatic, no cyanosis or edema Skin: Skin color, texture, turgor normal. No rashes or lesions Lymph nodes: Cervical, supraclavicular, and axillary nodes normal. No abnormal inguinal nodes palpated Neurologic: Grossly normal  Pelvic: External genitalia:  no lesions              Urethra:  normal appearing urethra with no masses, tenderness or lesions              Bartholins and Skenes: normal                 Vagina: normal appearing vagina with normal color and discharge, no lesions              Cervix: no lesions              Pap taken: Yes.    Bimanual Exam:  Uterus:  normal size, contour, position, consistency, mobility, non-tender              Adnexa: no mass, fullness, tenderness                Chaperone was present for exam.  Assessment:   Well woman visit with normal exam. Depression on COCs.  Doing well on Micornor.  Mostly has amenorrhea. Hx LGSIL.  Situational stress.  On Wellbutrin.  Tremor.   Plan: Mammogram screening. Recommended self breast awareness. Pap and HR HPV as above. Guidelines for Calcium, Vitamin D, regular exercise program including cardiovascular and weight bearing exercise. Gardasil vaccine series.  Will start today.  STD screening and routine labs.  Refill of Micronor and Wellbutrin XL, each for one year. Information on Tremor to patient.  Follow up annually and prn.   After visit summary provided.

## 2017-10-11 LAB — COMPREHENSIVE METABOLIC PANEL
A/G RATIO: 2 (ref 1.2–2.2)
ALT: 13 IU/L (ref 0–32)
AST: 16 IU/L (ref 0–40)
Albumin: 4.7 g/dL (ref 3.5–5.5)
Alkaline Phosphatase: 86 IU/L (ref 39–117)
BUN/Creatinine Ratio: 12 (ref 9–23)
BUN: 10 mg/dL (ref 6–20)
Bilirubin Total: 0.3 mg/dL (ref 0.0–1.2)
CALCIUM: 9.7 mg/dL (ref 8.7–10.2)
CO2: 24 mmol/L (ref 20–29)
CREATININE: 0.82 mg/dL (ref 0.57–1.00)
Chloride: 103 mmol/L (ref 96–106)
GFR, EST AFRICAN AMERICAN: 116 mL/min/{1.73_m2} (ref >59)
GFR, EST NON AFRICAN AMERICAN: 100 mL/min/{1.73_m2} (ref >59)
Globulin, Total: 2.4 g/dL (ref 1.5–4.5)
Glucose: 93 mg/dL (ref 65–99)
POTASSIUM: 3.8 mmol/L (ref 3.5–5.2)
Sodium: 142 mmol/L (ref 134–144)
TOTAL PROTEIN: 7.1 g/dL (ref 6.0–8.5)

## 2017-10-11 LAB — CBC
HEMATOCRIT: 43.7 % (ref 34.0–46.6)
Hemoglobin: 14.4 g/dL (ref 11.1–15.9)
MCH: 29.1 pg (ref 26.6–33.0)
MCHC: 33 g/dL (ref 31.5–35.7)
MCV: 88 fL (ref 79–97)
Platelets: 286 10*3/uL (ref 150–450)
RBC: 4.95 x10E6/uL (ref 3.77–5.28)
RDW: 13.3 % (ref 12.3–15.4)
WBC: 9 10*3/uL (ref 3.4–10.8)

## 2017-10-11 LAB — VITAMIN D 25 HYDROXY (VIT D DEFICIENCY, FRACTURES): Vit D, 25-Hydroxy: 23.5 ng/mL — ABNORMAL LOW (ref 30.0–100.0)

## 2017-10-11 LAB — LIPID PANEL
CHOLESTEROL TOTAL: 159 mg/dL (ref 100–199)
Chol/HDL Ratio: 3.1 ratio (ref 0.0–4.4)
HDL: 51 mg/dL (ref >39)
LDL Calculated: 96 mg/dL (ref 0–99)
Triglycerides: 62 mg/dL (ref 0–149)
VLDL Cholesterol Cal: 12 mg/dL (ref 5–40)

## 2017-10-11 LAB — TSH: TSH: 0.867 u[IU]/mL (ref 0.450–4.500)

## 2017-10-14 LAB — CYTOLOGY - PAP
CHLAMYDIA, DNA PROBE: NEGATIVE
Diagnosis: NEGATIVE
NEISSERIA GONORRHEA: NEGATIVE
Trichomonas: NEGATIVE

## 2017-12-08 NOTE — Progress Notes (Signed)
Patient in today for 2nd Gardasil injection.   Contraception: Oral Progesterone only  LMP: no cycle  Last AEX: 10/10/2017  with Dr Edward JollySilva  Injection given in Right deltoid . Patient tolerated shot well.   Patient informed next injection due in about 4 months.  Advised patient, if not on birth control, to return for next injection with cycle.   Routed to provider for final review.  Encounter closed.

## 2017-12-10 ENCOUNTER — Ambulatory Visit (INDEPENDENT_AMBULATORY_CARE_PROVIDER_SITE_OTHER): Payer: 59

## 2017-12-10 VITALS — BP 108/62 | HR 66 | Ht 63.0 in | Wt 123.0 lb

## 2017-12-10 DIAGNOSIS — Z23 Encounter for immunization: Secondary | ICD-10-CM

## 2018-04-07 ENCOUNTER — Ambulatory Visit (INDEPENDENT_AMBULATORY_CARE_PROVIDER_SITE_OTHER): Payer: 59

## 2018-04-07 VITALS — BP 102/60 | HR 76 | Resp 16 | Ht 63.0 in | Wt 122.0 lb

## 2018-04-07 DIAGNOSIS — Z23 Encounter for immunization: Secondary | ICD-10-CM

## 2018-04-07 NOTE — Progress Notes (Signed)
Patient in today for 3rd Gardasil injection.   Contraception: Oral Progesterone Only LMP: 04/03/18 Last AEX: 10/10/17 with Dr. Edward JollySilva  Injection given in Left Deltoid. Patient tolerated shot well.   Patient has completed the HPV series  Routed to provider for final review.  Encounter closed.

## 2018-04-10 ENCOUNTER — Ambulatory Visit: Payer: 59

## 2018-04-17 ENCOUNTER — Ambulatory Visit: Payer: 59

## 2018-08-18 ENCOUNTER — Encounter: Payer: Self-pay | Admitting: Obstetrics and Gynecology

## 2018-08-19 ENCOUNTER — Telehealth: Payer: Self-pay | Admitting: Obstetrics and Gynecology

## 2018-08-19 ENCOUNTER — Telehealth: Payer: Self-pay | Admitting: *Deleted

## 2018-08-19 NOTE — Telephone Encounter (Signed)
See previous telephone encounter dated 08/19/18.   Encounter closed.

## 2018-08-19 NOTE — Telephone Encounter (Signed)
See telephone encounter dated 08/19/18.

## 2018-08-19 NOTE — Telephone Encounter (Signed)
From [name] To Patton Salles, MD Sent 08/18/2018 5:39 PM  Hi there, Dr. Edward Jolly,   I wanted to check in since our scheduled annual exam isn't until July. I've been having some issues the past couple weeks and am not sure what is causing them. My symptoms have been:   -Feeling like the room/bed/chair is shaking while I'm lying down  -Twitching in my eye and hand  -Ear Ringing  -Heart palpitations/Chest Tightness (sporadic, not all the time)  -Exhaustion (I'm getting more sleep now that I'm not going into the office, probably about 10 hours more a week, and I'm even more tired)   My fiance took my blood pressure and it was normal. Do you have any ideas of what this could be? Should I do an e-visit with [facility]?   Thanks for any information.   Carolyn Jefferson

## 2018-08-19 NOTE — Telephone Encounter (Signed)
Spoke with patient. Reports feeling like bed is shaking when lying down, twitching in right eye and hand, ears ringing, chest tightening at the end of the day that is intermittent and fatigue. Symptoms started a couple of weeks ago. On POP, no menses with POP. Denies any GYN symptoms, fever/chills, N/V, SOB.   Advised patient to f/u with PCP/Urgent Care/ER for further evaluation of symptoms. If GYN visit still needed, return call to office. Covering provider will review, our office will return call if any additional recommendations.   Routing to provider for final review. Patient is agreeable to disposition. Will close encounter.  Cc: Dr. Edward Jolly

## 2018-08-19 NOTE — Telephone Encounter (Signed)
Message   Hi there, Dr. Edward Jolly,    I wanted to check in since our scheduled annual exam isn't until July. I've been having some issues the past couple weeks and am not sure what is causing them. My symptoms have been:    -Feeling like the room/bed/chair is shaking while I'm lying down  -Twitching in my eye and hand  -Ear Ringing   -Heart palpitations/Chest Tightness (sporadic, not all the time)  -Exhaustion (I'm getting more sleep now that I'm not going into the office, probably about 10 hours more a week, and I'm even more tired)    My fiance took my blood pressure and it was normal. Do you have any ideas of what this could be? Should I do an e-visit with [facility]?    Thanks for any information.    Carolyn Jefferson

## 2018-08-31 ENCOUNTER — Other Ambulatory Visit: Payer: Self-pay

## 2018-08-31 NOTE — Telephone Encounter (Signed)
Medication refill request: micronor Last AEX:  10-10-17 Next AEX: 11-06-2018 Last MMG (if hormonal medication request): none Refill authorized: please approve if appropriate.

## 2018-09-01 ENCOUNTER — Other Ambulatory Visit: Payer: Self-pay

## 2018-09-01 MED ORDER — NORETHINDRONE 0.35 MG PO TABS: 1.0000 | ORAL_TABLET | Freq: Every day | ORAL | 0 refills | Status: DC

## 2018-09-01 NOTE — Telephone Encounter (Signed)
Duplicate

## 2018-10-16 ENCOUNTER — Encounter: Payer: Self-pay | Admitting: Obstetrics and Gynecology

## 2018-10-16 ENCOUNTER — Other Ambulatory Visit: Payer: Self-pay | Admitting: Obstetrics and Gynecology

## 2018-10-16 ENCOUNTER — Telehealth: Payer: Self-pay | Admitting: Obstetrics and Gynecology

## 2018-10-16 MED ORDER — NORETHINDRONE 0.35 MG PO TABS: 1.0000 | ORAL_TABLET | Freq: Every day | ORAL | 1 refills | Status: DC

## 2018-10-16 NOTE — Telephone Encounter (Signed)
Micronor refill sent to pharmacy.  Rx for 3 months with one refill.

## 2018-10-16 NOTE — Telephone Encounter (Signed)
Patient would like refills called in on her current rx that was placed in Sep 01, 2018 for 3 months worth.  Please advise.

## 2018-10-16 NOTE — Telephone Encounter (Signed)
Patient sent the following correspondence through [place]. Routing to triage to assist patient with request.  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 My new insurance will not go into affect until October 1st. Can I can get my birth control refilled in the next week, as I'll be starting that new job July 1st? I tried to request a refill, but I got a notification that no more refills remained. I'd like to get it refilled so there won't be a lapse in coverage.    In the meantime, I will call the office on Monday to reschedule my annual for early October.    Thank you for any assistance!    Carolyn Jefferson

## 2018-11-06 ENCOUNTER — Ambulatory Visit: Payer: 59 | Admitting: Obstetrics and Gynecology

## 2018-11-10 ENCOUNTER — Ambulatory Visit: Payer: 59 | Admitting: Obstetrics and Gynecology

## 2019-01-11 ENCOUNTER — Other Ambulatory Visit: Payer: Self-pay | Admitting: *Deleted

## 2019-01-11 NOTE — Telephone Encounter (Signed)
Requesting refill on Wellbutrin and birth control pills.  Annual is scheduled for 01-29-2019. [facility]/ [place].

## 2019-01-11 NOTE — Telephone Encounter (Signed)
Medication refill request: Wellbutrin  Last AEX:  10-10-17 BS Next AEX: 02-01-2019 Last MMG (if hormonal medication request): n/a Refill authorized: Today, please advise.   Spoke with patient. Patient advised she has 1 refill remaining on current micronor prescription. Patient verbalized understanding. RN advised would send request for Wellbutrin to Dr. Quincy Simmonds. Patient agreeable.   Medication pended for #30, 0RF to confirmed pharmacy on file. Please refill if appropriate.

## 2019-01-12 ENCOUNTER — Other Ambulatory Visit: Payer: Self-pay | Admitting: Obstetrics and Gynecology

## 2019-01-12 ENCOUNTER — Other Ambulatory Visit: Payer: Self-pay

## 2019-01-12 MED ORDER — NORETHINDRONE 0.35 MG PO TABS
1.0000 | ORAL_TABLET | Freq: Every day | ORAL | 0 refills | Status: DC
Start: 2019-01-12 — End: 2019-02-01

## 2019-01-12 MED ORDER — BUPROPION HCL ER (XL) 300 MG PO TB24: 300.0000 mg | ORAL_TABLET | Freq: Every day | ORAL | 0 refills | Status: DC

## 2019-01-12 NOTE — Telephone Encounter (Signed)
Medication refill request: Bupropion Last AEX:  10/20/2017 Next AEX: 02/01/2019 Last MMG (if hormonal medication request): n/a Refill authorized: Pending #90 with no refills per pharmacy request if appropriate. Please advise.

## 2019-01-14 ENCOUNTER — Other Ambulatory Visit: Payer: Self-pay | Admitting: Obstetrics and Gynecology

## 2019-01-28 ENCOUNTER — Other Ambulatory Visit: Payer: Self-pay

## 2019-01-28 NOTE — Progress Notes (Signed)
28 y.o. G0P0000 Single Caucasian female here for annual exam.    She is experiencing shaking at night, and this is improving.  She did some virtual visits and dx was low magnesium.  Taking Wellbutrin in the am.  She donated blood July, 2019 and she had a neurological episode following this.  Slurring words, difficulty moving. Nothing like this happened before of after this.   Considering childbearing next year after the pandemic stabilizes.  Patient getting married Saturday!! Enjoying a new job.   PCP:   None  Patient's last menstrual period was 12/29/2018 (exact date).     Period Cycle (Days): (continuous OCPs--usually doesn't have cycle)     Sexually active: Yes.    The current method of family planning is oral progesterone-only contraceptive.    Exercising: Yes.    walks when she can Smoker:  no  Health Maintenance: Pap:  10-10-17 Neg, 09/27/16 LGSIL  History of abnormal Pap:  Yes, 09-27-16 LGSIL MMG:  n/a Colonoscopy:  n/a BMD:   n/a  Result  n/a TDaP:  2012 Gardasil:   Yes, completed HIV:11-01-16 NR Hep C:11-01-16 Neg Screening Labs:  Today.  Flu vaccine:  Recommended.  She wants to do after her wedding.    reports that she has never smoked. She has never used smokeless tobacco. She reports current alcohol use of about 1.0 - 2.0 standard drinks of alcohol per week. She reports that she does not use drugs.  Past Medical History:  Diagnosis Date  . Depression    while on OCPs  . 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 Situational stress 2018    Past Surgical History:  Procedure Laterality Date  . HYMENECTOMY N/A 09/05/2015   Procedure: HYMENECTOMY excision of hymenal remnant ;  Surgeon: Patton Salles, MD;  Location: [facility];  Service: Gynecology;  Laterality: N/A;  . NO PAST SURGERIES      Current Outpatient Medications  Medication Sig Dispense Refill  . buPROPion (WELLBUTRIN XL) 300 MG 24 hr tablet Take 1 tablet (300 mg total) by mouth daily. 30 tablet  0  . imiquimod (ALDARA) 5 % cream Apply 1 application topically as needed.    . norethindrone (MICRONOR) 0.35 MG tablet Take 1 tablet (0.35 mg total) by mouth daily. 28 tablet 0   No current facility-administered medications for this visit.     Family History  Problem Relation Age of Onset  . Ulcerative colitis Paternal Grandfather   . Stroke Paternal Grandfather   . Cancer Other        colon ca  . Cancer Other        colon ca  . Diabetes Maternal Grandfather     Review of Systems  Neurological: Positive for dizziness.  All other systems reviewed and are negative.   Exam:   BP 110/70   Pulse 84   Temp 98.2 F (36.8 C) (Temporal)   Resp 16   Ht 5\' 3"  (1.6 m)   Wt 121 lb (54.9 kg)   LMP 12/29/2018 (Exact Date)   BMI 21.43 kg/m     General appearance: alert, cooperative and appears stated age Head: normocephalic, without obvious abnormality, atraumatic Neck: no adenopathy, supple, symmetrical, trachea midline and thyroid normal to inspection and palpation Lungs: clear to auscultation bilaterally Breasts: normal appearance, no masses or tenderness, No nipple retraction or dimpling, No nipple discharge or bleeding, No axillary adenopathy Heart: regular rate and rhythm Abdomen: soft, non-tender; no masses, no organomegaly Extremities: extremities  normal, atraumatic, no cyanosis or edema Skin: skin color, texture, turgor normal. No rashes or lesions Lymph nodes: cervical, supraclavicular, and axillary nodes normal. Neurologic: grossly normal  Pelvic: External genitalia:  no lesions              No abnormal inguinal nodes palpated.              Urethra:  normal appearing urethra with no masses, tenderness or lesions              Bartholins and Skenes: normal                 Vagina: normal appearing vagina with normal color and discharge, no lesions              Cervix: no lesions.  Small amount of menstrual flow.               Pap taken: Yes.   Bimanual Exam:  Uterus:   normal size, contour, position, consistency, mobility, non-tender              Adnexa: no mass, fullness, tenderness                Chaperone was present for exam.  Assessment:   Well woman visit with normal exam. Hx LGSIL.  On Micronor.    Has depression on COCs. Situational stress.  On Wellbutrin XL.  Plan:  Self breast awareness reviewed. Pap and HR HPV as above. Guidelines for Calcium, Vitamin D, regular exercise program including cardiovascular and weight bearing exercise. Routine labs today.  Refill of Wellbutrin and Micronor.  Return for preconception consultation.  Follow up annually and prn.   After visit summary provided.

## 2019-02-01 ENCOUNTER — Other Ambulatory Visit: Payer: Self-pay

## 2019-02-01 ENCOUNTER — Encounter: Payer: Self-pay | Admitting: Obstetrics and Gynecology

## 2019-02-01 ENCOUNTER — Other Ambulatory Visit (HOSPITAL_COMMUNITY)
Admission: RE | Admit: 2019-02-01 | Discharge: 2019-02-01 | Disposition: A | Discharge status: Home / Self Care | Payer: 59 | Source: Ambulatory Visit | Attending: Obstetrics and Gynecology | Admitting: Obstetrics and Gynecology

## 2019-02-01 ENCOUNTER — Ambulatory Visit: Payer: 59 | Admitting: Obstetrics and Gynecology

## 2019-02-01 VITALS — BP 110/70 | HR 84 | Temp 98.2°F | Resp 16 | Ht 63.0 in | Wt 121.0 lb

## 2019-02-01 DIAGNOSIS — Z01419 Encounter for gynecological examination (general) (routine) without abnormal findings: Secondary | ICD-10-CM | POA: Insufficient documentation

## 2019-02-01 MED ORDER — NORETHINDRONE 0.35 MG PO TABS: 1.0000 | ORAL_TABLET | Freq: Every day | ORAL | 3 refills | Status: DC

## 2019-02-01 MED ORDER — BUPROPION HCL ER (XL) 300 MG PO TB24: 300.0000 mg | ORAL_TABLET | Freq: Every day | ORAL | 3 refills | Status: DC

## 2019-02-01 NOTE — Patient Instructions (Signed)

## 2019-02-02 LAB — COMPREHENSIVE METABOLIC PANEL
ALT: 11 IU/L (ref 0–32)
AST: 16 IU/L (ref 0–40)
Albumin/Globulin Ratio: 2 (ref 1.2–2.2)
Albumin: 4.9 g/dL (ref 3.9–5.0)
Alkaline Phosphatase: 83 IU/L (ref 39–117)
BUN/Creatinine Ratio: 12 (ref 9–23)
BUN: 11 mg/dL (ref 6–20)
Bilirubin Total: 0.5 mg/dL (ref 0.0–1.2)
CO2: 22 mmol/L (ref 20–29)
Calcium: 9.8 mg/dL (ref 8.7–10.2)
Chloride: 105 mmol/L (ref 96–106)
Creatinine, Ser: 0.89 mg/dL (ref 0.57–1.00)
GFR calc Af Amer: 103 mL/min/{1.73_m2} (ref >59)
GFR calc non Af Amer: 90 mL/min/{1.73_m2} (ref >59)
Globulin, Total: 2.4 g/dL (ref 1.5–4.5)
Glucose: 80 mg/dL (ref 65–99)
Potassium: 3.8 mmol/L (ref 3.5–5.2)
Sodium: 141 mmol/L (ref 134–144)
Total Protein: 7.3 g/dL (ref 6.0–8.5)

## 2019-02-02 LAB — CBC
Hematocrit: 45.7 % (ref 34.0–46.6)
Hemoglobin: 15 g/dL (ref 11.1–15.9)
MCH: 29.8 pg (ref 26.6–33.0)
MCHC: 32.8 g/dL (ref 31.5–35.7)
MCV: 91 fL (ref 79–97)
Platelets: 253 10*3/uL (ref 150–450)
RBC: 5.04 x10E6/uL (ref 3.77–5.28)
RDW: 12.6 % (ref 11.7–15.4)
WBC: 8.1 10*3/uL (ref 3.4–10.8)

## 2019-02-02 LAB — LIPID PANEL
Chol/HDL Ratio: 3.5 ratio (ref 0.0–4.4)
Cholesterol, Total: 178 mg/dL (ref 100–199)
HDL: 51 mg/dL (ref >39)
LDL Chol Calc (NIH): 112 mg/dL — ABNORMAL HIGH (ref 0–99)
Triglycerides: 79 mg/dL (ref 0–149)
VLDL Cholesterol Cal: 15 mg/dL (ref 5–40)

## 2019-02-02 LAB — VITAMIN D 25 HYDROXY (VIT D DEFICIENCY, FRACTURES): Vit D, 25-Hydroxy: 24.1 ng/mL — ABNORMAL LOW (ref 30.0–100.0)

## 2019-02-02 LAB — TSH: TSH: 1.21 u[IU]/mL (ref 0.450–4.500)

## 2019-02-03 ENCOUNTER — Encounter: Payer: Self-pay | Admitting: Obstetrics and Gynecology

## 2019-02-03 LAB — CYTOLOGY - PAP: Diagnosis: NEGATIVE

## 2019-03-20 ENCOUNTER — Encounter: Payer: Self-pay | Admitting: Obstetrics and Gynecology

## 2019-06-30 ENCOUNTER — Encounter: Payer: Self-pay | Admitting: Obstetrics and Gynecology

## 2019-06-30 NOTE — Telephone Encounter (Signed)
Hello,  My husband and I plan to start trying to get pregnant in April. I'd like to go ahead and start prenatal vitamins.  I know you formerly recommended I take a Vitamin D supplement with 2000 iu daily. Most prenatal vitamins I've researched have 600 iu. Is that enough, or should I take a prenatal and an additional vitamin D supplement?  All prenatal vitamins I've researched lack the daily recommendation of 1000 iu for calcium. Milk/dairy does make me sick, so I try to avoid this in my diet. Should I take a calcium supplement in addition to a prenatal?   Thank you for any assistance! I do still plan to visit before we start trying, if I can get an appointment in early April, but would like to start these vitamins sooner.  Have a nice day!  Carolyn Jefferson   Pt sent Mychart message.  Routing to Dr Edward Jolly for recommendations then will return call to pt and make OV.

## 2019-08-04 ENCOUNTER — Telehealth: Payer: Self-pay | Admitting: Obstetrics and Gynecology

## 2019-08-04 NOTE — Telephone Encounter (Signed)
Spoke with pt. Pt wanting to know if ok to have Covid vaccine while she and husband start trying for pregnancy. Pt states Covid vaccine is scheduled for 08/08/2019. Pt is not currently pregnant. Pt advised to keep Covid vaccine appt while she is not currently pregnant and hold off x 1 month before trying after 2nd vaccine to get pregnant per Covid guidelines. Will review with Dr Edward Jolly on any other recommendations and return call to pt. Pt agreeable.   Routing to Dr Edward Jolly.

## 2019-08-04 NOTE — Telephone Encounter (Signed)
Patient has some questions about pregnancy and the covid vaccine.

## 2019-08-05 NOTE — Telephone Encounter (Signed)
I recommend she proceed with vaccination.  Per Up To Date, it is not necessary to delay pregnancy planning after vaccination.

## 2019-08-05 NOTE — Telephone Encounter (Signed)
Spoke with pt. Pt given recommendations per Dr Edward Jolly. Pt agreeable.   Encounter closed.

## 2019-09-08 NOTE — Progress Notes (Signed)
GYNECOLOGY  VISIT   HPI: 29 y.o.   Married  Caucasian  female   G0P0000 with Patient's last menstrual period was 09/07/2019 (exact date).   here for preconception consult.    Patient stopped OCPs early April. She has LH monitoring strips.   She stopped Wellbutrin and is doing well off of it.   Has a new job now.   Has some warts on her hand.  She is seeing her dermatologist and getting yeast injections in her hands.  She has been told that this may be infectious to a new born.   Not [place] or [place] background.   She states that there is a medical illness in the family similar to Huntington's, but her maternal GF tested negative for this, so there is no ongoing concern.   Husband's brother had an MI in her 40s.  He survived.   TDap given 2012.  She did receive Wynetta Emery and [employer] vaccine.  Her husband had Pfizer Covid vaccine.   She completed Gardasil vaccine.   GYNECOLOGIC HISTORY: Patient's last menstrual period was 09/07/2019 (exact date). Contraception: None--trying Menopausal hormone therapy:  n/a Last mammogram:  n/a Last pap smear: 02-01-19 Neg, 10-10-17 Neg, 09/27/16 LGSIL         OB History    Gravida  0   Para  0   Term  0   Preterm  0   AB  0   Living  0     SAB  0   TAB  0   Ectopic  0   Multiple  0   Live Births                 There are no problems to display for this patient.   Past Medical History:  Diagnosis Date  . Depression    while on OCPs  . 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 Low vitamin D level   . Situational stress 2018    Past Surgical History:  Procedure Laterality Date  . HYMENECTOMY N/A 09/05/2015   Procedure: HYMENECTOMY excision of hymenal remnant ;  Surgeon: Nunzio Cobbs, MD;  Location: [place];  Service: Gynecology;  Laterality: N/A;  . NO PAST SURGERIES      Current Outpatient Medications  Medication Sig Dispense Refill  . Prenatal Vit-Fe Fumarate-FA (PRENATAL VITAMIN  PO) Take 1 tablet by mouth daily.     No current facility-administered medications for this visit.     ALLERGIES: Patient has no known allergies.  Family History  Problem Relation Age of Onset  . Ulcerative colitis Paternal Grandfather   . Stroke Paternal Grandfather   . Cancer Other        colon ca  . Cancer Other        colon ca  . Diabetes Maternal Grandfather     Social History   Socioeconomic History  . Marital status: Married    Spouse name: Not on file  . Number of children: Not on file  . Years of education: Not on file  . Highest education level: Not on file  Occupational History  . Not on file  Tobacco Use  . Smoking status: Never Smoker  . Smokeless tobacco: Never Used  Substance and Sexual Activity  . Alcohol use: Yes    Alcohol/week: 1.0 - 2.0 standard drinks    Types: 1 - 2 Cans of beer per week  . Drug use: Never  . Sexual activity: Yes  Partners: Male    Birth control/protection: Pill    Comment: Micronor--continuously  Other Topics Concern  . Not on file  Social History Narrative  . Not on file   Social Determinants of Health   Financial Resource Strain:   . Difficulty of Paying Living Expenses:   Food Insecurity:   . Worried About Programme researcher, broadcasting/film/video in the Last Year:   . Barista in the Last Year:   Transportation Needs:   . Freight forwarder (Medical):   Marland Kitchen Lack of Transportation (Non-Medical):   Physical Activity:   . Days of Exercise per Week:   . Minutes of Exercise per Session:   Stress:   . Feeling of Stress :   Social Connections:   . Frequency of Communication with Friends and Family:   . Frequency of Social Gatherings with Friends and Family:   . Attends Religious Services:   . Active Member of Clubs or Organizations:   . Attends Banker Meetings:   Marland Kitchen Marital Status:   Intimate Partner Violence:   . Fear of Current or Ex-Partner:   . Emotionally Abused:   Marland Kitchen Physically Abused:   . Sexually  Abused:     Review of Systems  All other systems reviewed and are negative.   PHYSICAL EXAMINATION:    BP 110/68   Pulse 60   Temp 97.8 F (36.6 C) (Temporal)   Ht 5' 3.5" (1.613 m)   Wt 129 lb 3.2 oz (58.6 kg)   LMP 09/07/2019 (Exact Date)   BMI 22.53 kg/m     General appearance: alert, cooperative and appears stated age  ASSESSMENT  Preconception counseling.   PLAN  Continue PNV.  Check Rubella titer.  Do not change the litter box.  Fertile time discussed.  Avoid exposures.  Exercise and nutrition discussed.  What to Expect Before Expecting recommended. List of OB providers to patient.    An After Visit Summary was printed and given to the patient.  __30____ minutes face to face time of which over 50% was spent in counseling.

## 2019-09-10 ENCOUNTER — Other Ambulatory Visit: Payer: Self-pay

## 2019-09-13 ENCOUNTER — Encounter: Payer: Self-pay | Admitting: Obstetrics and Gynecology

## 2019-09-13 ENCOUNTER — Ambulatory Visit: Payer: 59 | Admitting: Obstetrics and Gynecology

## 2019-09-13 ENCOUNTER — Other Ambulatory Visit: Payer: Self-pay

## 2019-09-13 VITALS — BP 110/68 | HR 60 | Temp 97.8°F | Ht 63.5 in | Wt 129.2 lb

## 2019-09-13 DIAGNOSIS — Z3169 Encounter for other general counseling and advice on procreation: Secondary | ICD-10-CM | POA: Diagnosis not present

## 2019-09-14 LAB — RUBELLA SCREEN: Rubella Antibodies, IGG: 3.41 index (ref >0.99)

## 2020-02-14 ENCOUNTER — Ambulatory Visit: Payer: 59 | Admitting: Obstetrics and Gynecology

## 2020-04-18 NOTE — Progress Notes (Signed)
29 y.o. G70P0000 Married Caucasian female here for annual exam.    Doing laser treatment for warts.   Stopped birth control in April and is taking a break from trying for pregnancy.  She did ovulation check, and is ovulating.   Did Laural Benes and [employer] vaccine.  She is planning a booster vaccine.  No flu vaccine this year.   Had Covid in December, 2020.   PCP:  [facility]   Patient's last menstrual period was 03/30/2020 (exact date).     Period Cycle (Days): 30 Period Duration (Days): 5 Period Pattern: Regular Menstrual Flow: Light Menstrual Control: Maxi pad Menstrual Control Change Freq (Hours): changes pad every 4 hours on heaviest day Dysmenorrhea: None     Sexually active: Yes.    The current method of family planning is none.    Exercising: No.  The patient does not participate in regular exercise at present. Smoker:  no  Health Maintenance: Pap: 02-01-19 Neg, 10-10-17 Neg,09/27/16 LGSIL History of abnormal Pap:  Yes, Hx 09-27-16 LGSIL;no treatment. MMG:  n/a Colonoscopy:  n/a BMD:   n/a  Result  n/a TDaP: 2012 Gardasil:   Yes, completed HIV: 11-01-16 NR Hep C: 11-01-16 Neg Screening Labs:  PCP. xx   reports that she has never smoked. She has never used smokeless tobacco. She reports current alcohol use of about 1.0 - 2.0 standard drink of alcohol per week. She reports that she does not use drugs.  Past Medical History:  Diagnosis Date  . Depression    while on OCPs  . 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 Low vitamin D level   . Situational stress 2018    Past Surgical History:  Procedure Laterality Date  . HYMENECTOMY N/A 09/05/2015   Procedure: HYMENECTOMY excision of hymenal remnant ;  Surgeon: Patton Salles, MD;  Location: [facility];  Service: Gynecology;  Laterality: N/A;  . NO PAST SURGERIES      Current Outpatient Medications  Medication Sig Dispense Refill  . hydroquinone 4 % cream Apply topically.     No current  facility-administered medications for this visit.    Family History  Problem Relation Age of Onset  . Ulcerative colitis Paternal Grandfather   . Stroke Paternal Grandfather   . Cancer Other        colon ca  . Cancer Other        colon ca  . Diabetes Maternal Grandfather     Review of Systems  All other systems reviewed and are negative.   Exam:   BP 104/70   Pulse 78   Ht 5\' 3"  (1.6 m)   Wt 136 lb (61.7 kg)   LMP 03/30/2020 (Exact Date)   SpO2 99%   BMI 24.09 kg/m     General appearance: alert, cooperative and appears stated age Head: normocephalic, without obvious abnormality, atraumatic Neck: no adenopathy, supple, symmetrical, trachea midline and thyroid normal to inspection and palpation Lungs: clear to auscultation bilaterally Breasts: normal appearance, no masses or tenderness, No nipple retraction or dimpling, No nipple discharge or bleeding, No axillary adenopathy Heart: regular rate and rhythm Abdomen: soft, non-tender; no masses, no organomegaly Extremities: extremities normal, atraumatic, no cyanosis or edema Skin: skin color, texture, turgor normal. Lesions of the hands consistent with laser treatment.  Lymph nodes: cervical, supraclavicular, and axillary nodes normal. Neurologic: grossly normal  Pelvic: External genitalia:  no lesions  No abnormal inguinal nodes palpated.              Urethra:  normal appearing urethra with no masses, tenderness or lesions              Bartholins and Skenes: normal                 Vagina: normal appearing vagina with normal color and discharge, no lesions              Cervix: no lesions              Pap taken: No. Bimanual Exam:  Uterus:  normal size, contour, position, consistency, mobility, non-tender              Adnexa: no mass, fullness, tenderness       Chaperone was present for exam.  Assessment:   Well woman visit with normal exam. Considering future pregnancy.   Plan: Mammogram screening  discussed. Self breast awareness reviewed. Pap 2023. Guidelines for Calcium, Vitamin D, regular exercise program including cardiovascular and weight bearing exercise. Restart PNV.  Covid booster and flu vaccine reviewed.  Follow up annually and prn.

## 2020-04-19 ENCOUNTER — Other Ambulatory Visit: Payer: Self-pay

## 2020-04-19 ENCOUNTER — Encounter: Payer: Self-pay | Admitting: Obstetrics and Gynecology

## 2020-04-19 ENCOUNTER — Ambulatory Visit: Payer: No Typology Code available for payment source | Admitting: Obstetrics and Gynecology

## 2020-04-19 VITALS — BP 104/70 | HR 78 | Ht 63.0 in | Wt 136.0 lb

## 2020-04-19 DIAGNOSIS — Z01419 Encounter for gynecological examination (general) (routine) without abnormal findings: Secondary | ICD-10-CM | POA: Diagnosis not present

## 2020-04-19 NOTE — Patient Instructions (Signed)

## 2020-08-09 ENCOUNTER — Ambulatory Visit: Payer: 59 | Admitting: Obstetrics and Gynecology

## 2021-05-15 ENCOUNTER — Ambulatory Visit: Payer: No Typology Code available for payment source | Admitting: Obstetrics and Gynecology

## 2021-07-24 ENCOUNTER — Other Ambulatory Visit (HOSPITAL_COMMUNITY)
Admission: RE | Admit: 2021-07-24 | Discharge: 2021-07-24 | Disposition: A | Discharge status: Home / Self Care | Payer: 59 | Source: Ambulatory Visit | Attending: Obstetrics and Gynecology | Admitting: Obstetrics and Gynecology

## 2021-07-24 ENCOUNTER — Encounter: Payer: Self-pay | Admitting: Obstetrics and Gynecology

## 2021-07-24 ENCOUNTER — Ambulatory Visit (INDEPENDENT_AMBULATORY_CARE_PROVIDER_SITE_OTHER): Payer: 59 | Admitting: Obstetrics and Gynecology

## 2021-07-24 ENCOUNTER — Telehealth: Payer: Self-pay | Admitting: Obstetrics and Gynecology

## 2021-07-24 ENCOUNTER — Other Ambulatory Visit: Payer: Self-pay

## 2021-07-24 VITALS — BP 110/62 | HR 68 | Ht 63.0 in | Wt 145.0 lb

## 2021-07-24 DIAGNOSIS — Z01419 Encounter for gynecological examination (general) (routine) without abnormal findings: Secondary | ICD-10-CM | POA: Diagnosis not present

## 2021-07-24 DIAGNOSIS — N393 Stress incontinence (female) (male): Secondary | ICD-10-CM

## 2021-07-24 DIAGNOSIS — R7989 Other specified abnormal findings of blood chemistry: Secondary | ICD-10-CM

## 2021-07-24 DIAGNOSIS — Z124 Encounter for screening for malignant neoplasm of cervix: Secondary | ICD-10-CM | POA: Insufficient documentation

## 2021-07-24 DIAGNOSIS — R718 Other abnormality of red blood cells: Secondary | ICD-10-CM

## 2021-07-24 DIAGNOSIS — E782 Mixed hyperlipidemia: Secondary | ICD-10-CM

## 2021-07-24 DIAGNOSIS — Z0189 Encounter for other specified special examinations: Secondary | ICD-10-CM

## 2021-07-24 NOTE — Telephone Encounter (Signed)
Please make referral for pelvic floor therapy for stress urinary incontinence.  ? ?Patient will go to [facility] for this. ?

## 2021-07-24 NOTE — Patient Instructions (Signed)

## 2021-07-24 NOTE — Telephone Encounter (Signed)
Referral placed at [facility] center they will call to schedule. ?

## 2021-07-24 NOTE — Progress Notes (Signed)
31 y.o. G1P1 Married Caucasian female here for annual exam.   ? ?Has 83 month old baby girl! ? ?Menses are irregular following delivery of her baby.  ?LMP 06/29/21 - 07/13/21.  Spotted for several days before the flow started.  ?Prior LMP in February was a similar pattern.  ? ?Leaking urine with cough or sneeze.  ?This is mild.  ? ?Planning on another pregnancy and may try this summer.  ? ?PCP: [facility] ? ?Patient's last menstrual period was 06/29/2021 (exact date).     ?Period Pattern: (!) Irregular ?    ?Sexually active: Yes.    ?The current method of family planning is rhythm.   ?Exercising: No.   Walking  ?Smoker:  no ? ?Health Maintenance: ?Pap:  02-01-19 Neg, 10-10-17 Neg, 09/27/16 LGSIL  ?History of abnormal Pap:  yes, Hx 09-27-16 LGSIL;no treatment. ?MMG:  n/a ?Colonoscopy:  n/a ?BMD:   n/a  Result  n/a ?TDaP: 01/2021 ?Gardasil:   yes, completed ?HIV:11-01-16 NR ?Hep C:11-01-16 Neg ?Screening Labs:  today. ? ? reports that she has never smoked. She has never used smokeless tobacco. She reports that she does not currently use alcohol. She reports that she does not use drugs. ? ?Past Medical History:  ?Diagnosis Date  ? Depression   ? while on OCPs  ? 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 Low vitamin D level   ? Situational stress 2018  ? ? ?Past Surgical History:  ?Procedure Laterality Date  ? HYMENECTOMY N/A 09/05/2015  ? Procedure: HYMENECTOMY excision of hymenal remnant ;  Surgeon: Patton Salles, MD;  Location: [facility];  Service: Gynecology;  Laterality: N/A;  ? NO PAST SURGERIES    ? ? ?No current outpatient medications on file.  ? ?No current facility-administered medications for this visit.  ? ? ?Family History  ?Problem Relation Age of Onset  ? Ulcerative colitis Paternal Grandfather   ? Stroke Paternal Grandfather   ? Cancer Other   ?     colon ca  ? Cancer Other   ?     colon ca  ? Diabetes Maternal Grandfather   ? ? ?Review of Systems  ?Genitourinary:  Positive for menstrual  problem (long/irregular cycles).  ?All other systems reviewed and are negative. ? ?Exam:   ?BP 110/62   Pulse 68   Ht 5\' 3"  (1.6 m)   Wt 145 lb (65.8 kg)   LMP 06/29/2021 (Exact Date)   SpO2 99%   BMI 25.69 kg/m?     ?General appearance: alert, cooperative and appears stated age ?Head: normocephalic, without obvious abnormality, atraumatic ?Neck: no adenopathy, supple, symmetrical, trachea midline and thyroid normal to inspection and palpation ?Lungs: clear to auscultation bilaterally ?Breasts: normal appearance, no masses or tenderness, No nipple retraction or dimpling, No nipple discharge or bleeding, No axillary adenopathy ?Heart: regular rate and rhythm ?Abdomen: soft, non-tender; no masses, no organomegaly ?Extremities: extremities normal, atraumatic, no cyanosis or edema ?Skin: skin color, texture, turgor normal. No rashes or lesions ?Lymph nodes: cervical, supraclavicular, and axillary nodes normal. ?Neurologic: grossly normal ? ?Pelvic: External genitalia:  no lesions ?             No abnormal inguinal nodes palpated. ?             Urethra:  normal appearing urethra with no masses, tenderness or lesions ?             Bartholins and Skenes: normal    ?  Vagina: normal appearing vagina with normal color and discharge, no lesions ?             Cervix: no lesions ?             Pap taken: yes ?Bimanual Exam:  Uterus:  normal size, contour, position, consistency, mobility, non-tender ?             Adnexa: no mass, fullness, tenderness ?              ? ?Chaperone was present for exam:  Marchelle Folks, CMA ? ?Assessment:   ?Well woman visit with gynecologic exam. ?Hx LGSIL pap. ?Hx low vit D.  ?Stress incontinence.  ? ?Plan: ?Mammogram screening discussed. ?Self breast awareness reviewed. ?Pap and HR HPV as above. ?Guidelines for Calcium, Vitamin D, regular exercise program including cardiovascular and weight bearing exercise. ?Restart PNV.  ?Routine labs.  ?She will call if her cycles do not normalize.   ?We talked about ways to check for ovulation:  BBT charting, ovulation kits to check LH and day #21 progesterone.  ?Referral for pelvic floor therapy.  ?Follow up annually and prn.  ? ?After visit summary provided.  ? ? ? ?

## 2021-07-25 LAB — CYTOLOGY - PAP
Comment: NEGATIVE
Diagnosis: NEGATIVE
High risk HPV: NEGATIVE

## 2021-07-25 LAB — COMPREHENSIVE METABOLIC PANEL
AG Ratio: 1.7 (calc) (ref 1.0–2.5)
ALT: 42 U/L — ABNORMAL HIGH (ref 6–29)
AST: 28 U/L (ref 10–30)
Albumin: 5 g/dL (ref 3.6–5.1)
Alkaline phosphatase (APISO): 98 U/L (ref 31–125)
BUN: 11 mg/dL (ref 7–25)
CO2: 29 mmol/L (ref 20–32)
Calcium: 10 mg/dL (ref 8.6–10.2)
Chloride: 102 mmol/L (ref 98–110)
Creat: 0.68 mg/dL (ref 0.50–0.96)
Globulin: 2.9 g/dL (calc) (ref 1.9–3.7)
Glucose, Bld: 72 mg/dL (ref 65–99)
Potassium: 4.1 mmol/L (ref 3.5–5.3)
Sodium: 142 mmol/L (ref 135–146)
Total Bilirubin: 0.3 mg/dL (ref 0.2–1.2)
Total Protein: 7.9 g/dL (ref 6.1–8.1)

## 2021-07-25 LAB — LIPID PANEL
Cholesterol: 242 mg/dL — ABNORMAL HIGH (ref <200)
HDL: 66 mg/dL (ref >=50)
LDL Cholesterol (Calc): 146 mg/dL (calc) — ABNORMAL HIGH
Non-HDL Cholesterol (Calc): 176 mg/dL (calc) — ABNORMAL HIGH (ref <130)
Total CHOL/HDL Ratio: 3.7 (calc) (ref <5.0)
Triglycerides: 164 mg/dL — ABNORMAL HIGH (ref <150)

## 2021-07-25 LAB — CBC
HCT: 45.1 % — ABNORMAL HIGH (ref 35.0–45.0)
Hemoglobin: 14.8 g/dL (ref 11.7–15.5)
MCH: 29.2 pg (ref 27.0–33.0)
MCHC: 32.8 g/dL (ref 32.0–36.0)
MCV: 89.1 fL (ref 80.0–100.0)
MPV: 12.3 fL (ref 7.5–12.5)
Platelets: 312 10*3/uL (ref 140–400)
RBC: 5.06 10*6/uL (ref 3.80–5.10)
RDW: 12.4 % (ref 11.0–15.0)
WBC: 8.7 10*3/uL (ref 3.8–10.8)

## 2021-07-25 LAB — VITAMIN D 25 HYDROXY (VIT D DEFICIENCY, FRACTURES): Vit D, 25-Hydroxy: 46 ng/mL (ref 30–100)

## 2021-07-25 NOTE — Telephone Encounter (Signed)
Patient scheduled on 08/08/21. ?

## 2021-07-28 NOTE — Addendum Note (Signed)
Addended by: Ardell Isaacs, Debbe Bales E on: 07/28/2021 02:29 PM ? ? Modules accepted: Orders ? ?

## 2021-08-08 ENCOUNTER — Ambulatory Visit: Payer: 59 | Attending: Obstetrics and Gynecology | Admitting: Physical Therapy

## 2021-08-08 ENCOUNTER — Encounter: Payer: Self-pay | Admitting: Physical Therapy

## 2021-08-08 DIAGNOSIS — R279 Unspecified lack of coordination: Secondary | ICD-10-CM | POA: Diagnosis not present

## 2021-08-08 DIAGNOSIS — M6281 Muscle weakness (generalized): Secondary | ICD-10-CM | POA: Diagnosis not present

## 2021-08-08 DIAGNOSIS — N393 Stress incontinence (female) (male): Secondary | ICD-10-CM | POA: Diagnosis present

## 2021-08-08 DIAGNOSIS — N3946 Mixed incontinence: Secondary | ICD-10-CM

## 2021-08-08 DIAGNOSIS — R293 Abnormal posture: Secondary | ICD-10-CM | POA: Insufficient documentation

## 2021-08-08 NOTE — Therapy (Signed)
?OUTPATIENT PHYSICAL THERAPY FEMALE PELVIC EVALUATION ? ? ?Patient Name: Carolyn Jefferson ?MRN: [id number] ?DOB:10-23-1990, 31 y.o., female ?Today's Date: 08/08/2021 ? ? ? ?Past Medical History:  ?Diagnosis Date  ? Depression   ? while on OCPs  ? 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 Low vitamin D level   ? Situational stress 2018  ? ?Past Surgical History:  ?Procedure Laterality Date  ? HYMENECTOMY N/A 09/05/2015  ? Procedure: HYMENECTOMY excision of hymenal remnant ;  Surgeon: Nunzio Cobbs, MD;  Location: [place] [facility];  Service: Gynecology;  Laterality: N/A;  ? NO PAST SURGERIES    ? ?There are no problems to display for this patient. ? ? ?PCP: Patient, No Pcp Per (Inactive) ? ?REFERRING PROVIDER: Aundria Rud* ? ?REFERRING DIAG: N39.3 (ICD-10-CM) - Urinary, incontinence, stress female ? ?THERAPY DIAG:  ?Muscle weakness (generalized) ? ?Abnormal posture ? ?Unspecified lack of coordination ? ?Other muscle spasm ? ?Mixed incontinence ? ?ONSET DATE: 6 months ago ? ?SUBJECTIVE:                                                                                                                                                                                          ? ?SUBJECTIVE STATEMENT: ?Sneezing, laughing, coughing, vomiting stressed leakage since having baby ?Fluid intake: Yes: but unsure how much   ? ?Patient confirms identification and approves PT to assess pelvic floor and treatment Yes ? ? ?PAIN:  ?Are you having pain? No ? ? ?PRECAUTIONS: Other: postpartum ? ?WEIGHT BEARING RESTRICTIONS No ? ?FALLS:  ?Has patient fallen in last 6 months? No ? ?LIVING ENVIRONMENT: ?Lives with: lives with their family ?Lives in: House/apartment ? ? ?OCCUPATION: stay at home mom ? ?PLOF: Independent ? ?PATIENT GOALS to have less leakage ? ?PERTINENT HISTORY:  ?One vaginal birth 5 months ago ?Sexual abuse: No ? ?BOWEL MOVEMENT ?Pain with bowel movement: No ?Type of bowel movement:Type (Bristol Stool Scale) 4,  Frequency daily, and Strain No ?Fully empty rectum: Yes:   ?Leakage: No ?Pads: No ?Fiber supplement: No ? ?URINATION ?Pain with urination: No ?Fully empty bladder: Yes:   ?Stream: Strong ?Urgency: No ?Frequency: 3-4 hours ?Leakage: Coughing, Sneezing, Laughing, Exercise, and Lifting ?Pads: Yes: not for leakage but wears one all the time due to moisture and was doing this at baseline ? ?INTERCOURSE ?Pain with intercourse:  no pain or dryness ?Ability to have vaginal penetration:  Yes:   ?Climax: no pain ?Marinoff Scale: 0/3 ? ?PREGNANCY ?Vaginal deliveries 1 ?Tearing Yes: 2nd degree ?C-section deliveries 0 ?Currently pregnant No ? ?PROLAPSE ?None ? ? ? ?OBJECTIVE:  ? ?DIAGNOSTIC FINDINGS:  ? ? ?  COGNITION: ? Overall cognitive status: Within functional limits for tasks assessed   ?  ?SENSATION: ? Light touch: Appears intact ? Proprioception: Appears intact ? ?MUSCLE LENGTH: ?Bil hamstrings and adductors limited by 25% ? ? ? ? ?POSTURE:  ?Mildly rounded shoulders  ? ?LUMBARAROM/PROM ? ?A/PROM A/PROM  ?08/08/2021  ?Flexion Limited by 25%  ?Extension WFL  ?Right lateral flexion Limited by 25%  ?Left lateral flexion Limited by 25%  ?Right rotation Limited by 25%  ?Left rotation Limited by 25%  ? (Blank rows = not tested) ? ?LE ROM: ? ?WFL ? ?LE MMT: ? ?Bil hip abduction 4/5 all others 4+/5 in bli LE ? ?PELVIC MMT: ?  ?MMT  ?08/08/2021  ?Vaginal 4/5; 8s; 6 reps in hooklying   ?Internal Anal Sphincter   ?External Anal Sphincter   ?Puborectalis   ?Diastasis Recti   ?(Blank rows = not tested) ? ?      PALPATION: ?  General  no TTP ? ?              External Perineal Exam WFL ?              ?              Internal Pelvic Floor no TTP, decreased mobility at scar site noted but no pain per pt ? ?TONE: ?WFL ? ?PROLAPSE: ?None noted in hooklying  ? ?TODAY'S TREATMENT  ? ? ?08/08/2021 EVAL Examination completed, findings reviewed, pt educated on POC, HEP, and knack method, proper technique with pelvic floor mobility. Pt motivated to  participate in PT and agreeable to attempt recommendations.  ? ? ? ?PATIENT EDUCATION:  ?Education details: [id number] ?Person educated: Patient ?Education method: Explanation, Demonstration, Tactile cues, Verbal cues, and Handouts ?Education comprehension: verbalized understanding and returned demonstration ? ? ?HOME EXERCISE PROGRAM: ?[id number] ? ?ASSESSMENT: ? ?CLINICAL IMPRESSION: ?Patient is a 31 y.o. female  who was seen today for physical therapy evaluation and treatment for urinary incontinence postpartum. Pt is currently 5 months postpartum status post vaginal birth of first child with 2nd degree tearing. Pt reports she started having leakage during end of pregnancy and has continues to occur. Pt found to have mild weakness at pelvic floor and decreased coordination, this assessed in hooklying and educated to attempt HEP in sitting and went over all HEP. Pt denied additional questions and motivated to participate in PT. Pt would benefit from additional PT to further address deficits.   ? ? ?OBJECTIVE IMPAIRMENTS decreased coordination, decreased endurance, decreased mobility, decreased strength, increased fascial restrictions, impaired flexibility, improper body mechanics, and postural dysfunction.  ? ?ACTIVITY LIMITATIONS community activity.  ? ?PERSONAL FACTORS 1 comorbidity: one vaginal birth with 2nd degree tearing  are also affecting patient's functional outcome.  ? ? ?REHAB POTENTIAL: Good ? ?CLINICAL DECISION MAKING: Stable/uncomplicated ? ?EVALUATION COMPLEXITY: Low ? ? ?GOALS: ?Goals reviewed with patient? Yes ? ?SHORT TERM GOALS: Target date: 09/05/2021 ? ?Pt to be I with HEP. ?Baseline: ?Goal status: INITIAL ? ?2.  Pt to demonstrate improved lifting mechanics of 15# without compensatory strategies for 10 reps without leakage for improved mechanics for childcare.  ?Baseline:  ?Goal status: INITIAL ? ?3.  Pt to demonstrate improved coordination of breathing and pelvic floor with functional tasks  without leakage at least 50% of the time to decrease leakage.  ?Baseline:  ?Goal status: INITIAL ? ? ? ?LONG TERM GOALS: Target date: 11/07/2021 ? ?Pt to be I with advanced HEP. ?Baseline:  ?Goal status: INITIAL ? ?2.  Pt to demonstrate improved lifting mechanics of 25# without compensatory strategies for 10 reps without leakage for improved mechanics for childcare.  ?Baseline:  ?Goal status: INITIAL ? ?3.  Pt to demonstrate improved coordination of breathing and pelvic floor with functional tasks without leakage at least 75% of the time to decrease leakage.  ?Baseline:  ?Goal status: INITIAL ? ?4.  Pt to demonstrate at least 5/5 bil hip strength for improved pelvic stability and functional squats without leakage. ? ?Baseline:  ?Goal status: INITIAL ? ?5.  Pt to demonstrate at least 5/5 pelvic floor strength for improved pelvic stability and decreased strain at pelvic floor/ decrease leakage.  ?Baseline:  ?Goal status: INITIAL ? ? ? ?PLAN: ?PT FREQUENCY:  every other week ? ?PT DURATION:  6 sessions ? ?PLANNED INTERVENTIONS: Therapeutic exercises, Therapeutic activity, Neuromuscular re-education, Patient/Family education, Joint mobilization, Dry Needling, Spinal mobilization, Cryotherapy, Moist heat, Manual lymph drainage, scar mobilization, Taping, Biofeedback, and Manual therapy ? ?PLAN FOR NEXT SESSION: go over coordination with pelvic floor and breathing mechanics ? ? ?Stacy Gardner, PT, DPT ?04/12/235:08 PM  ? ?

## 2021-08-22 ENCOUNTER — Ambulatory Visit: Payer: 59 | Admitting: Physical Therapy

## 2021-08-22 DIAGNOSIS — N393 Stress incontinence (female) (male): Secondary | ICD-10-CM | POA: Diagnosis not present

## 2021-08-22 DIAGNOSIS — M6281 Muscle weakness (generalized): Secondary | ICD-10-CM

## 2021-08-22 DIAGNOSIS — R293 Abnormal posture: Secondary | ICD-10-CM

## 2021-08-22 DIAGNOSIS — R279 Unspecified lack of coordination: Secondary | ICD-10-CM

## 2021-08-22 NOTE — Therapy (Addendum)
?OUTPATIENT PHYSICAL THERAPY TREATMENT NOTE ? ? ?Patient Name: Carolyn Jefferson ?MRN: [id number] ?DOB:February 20, 1991, 31 y.o., female ?Today's Date: 08/22/2021 ? ?PCP: Patient, No Pcp Per (Inactive) ?REFERRING PROVIDER: Aundria Rud* ? ?END OF SESSION:  ? PT End of Session - 08/22/21 1651   ? ? Visit Number 2   ? Date for PT Re-Evaluation 11/07/21   ? Authorization Type [employer]   ? PT Start Time 1616   ? PT Stop Time 1655   ? PT Time Calculation (min) 39 min   ? Activity Tolerance Patient tolerated treatment well   ? Behavior During Therapy [facility] for tasks assessed/performed   ? ?  ?  ? ?  ? ? ?Past Medical History:  ?Diagnosis Date  ? Depression   ? while on OCPs  ? 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 Low vitamin D level   ? Situational stress 2018  ? ?Past Surgical History:  ?Procedure Laterality Date  ? HYMENECTOMY N/A 09/05/2015  ? Procedure: HYMENECTOMY excision of hymenal remnant ;  Surgeon: Nunzio Cobbs, MD;  Location: [place] [facility];  Service: Gynecology;  Laterality: N/A;  ? NO PAST SURGERIES    ? ?There are no problems to display for this patient. ? ? ?REFERRING DIAG: N39.3 (ICD-10-CM) - Urinary, incontinence, stress female ? ?THERAPY DIAG:  ?Muscle weakness (generalized) ? ?Abnormal posture ? ?Unspecified lack of coordination ? ?PERTINENT HISTORY: One vaginal birth 5 months ago ?Sexual abuse: No ? ?PRECAUTIONS: postpartum ? ?SUBJECTIVE: Pt reports she hasn't had any leakage since eval.  ? ?PAIN:  ?Are you having pain? No ? ? ?OBJECTIVE: (objective measures completed at initial evaluation unless otherwise dated) ? ? ?DIAGNOSTIC FINDINGS:  ?  ?  ?COGNITION: ?           Overall cognitive status: Within functional limits for tasks assessed              ?            ?SENSATION: ?           Light touch: Appears intact ?           Proprioception: Appears intact ?  ?MUSCLE LENGTH: ?Bil hamstrings and adductors limited by 25% ?  ?  ?  ?  ?POSTURE:  ?Mildly rounded shoulders  ?  ?LUMBARAROM/PROM ?  ?A/PROM  A/PROM  ?08/08/2021  ?Flexion Limited by 25%  ?Extension WFL  ?Right lateral flexion Limited by 25%  ?Left lateral flexion Limited by 25%  ?Right rotation Limited by 25%  ?Left rotation Limited by 25%  ? (Blank rows = not tested) ?  ?LE ROM: ?  ?WFL ?  ?LE MMT: ?  ?Bil hip abduction 4/5 all others 4+/5 in bli LE ?  ?PELVIC MMT: ?  ?MMT   ?08/08/2021  ?Vaginal 4/5; 8s; 6 reps in hooklying   ?Internal Anal Sphincter    ?External Anal Sphincter    ?Puborectalis    ?Diastasis Recti    ?(Blank rows = not tested) ?  ?      PALPATION: ?  General  no TTP ?  ?              External Perineal Exam WFL ?              ?              Internal Pelvic Floor no TTP, decreased mobility at scar site noted but no pain per pt ?  ?  TONE: ?WFL ?  ?PROLAPSE: ?None noted in hooklying  ?  ?TODAY'S TREATMENT  ? 08/22/2021: ?2x10 sidelying hip abduction with ball squeeze ?2x10 opp hand/knee press ?2x10 bridge with ball squeeze ?X10 15# Sit to stand from mat table ?X10 15# squats ?2x10 green band rotational palloffs ?All exercises with extra time for cues for technique, breathing and pelvic floor coordination.  ?  ?08/08/2021 EVAL Examination completed, findings reviewed, pt educated on POC, HEP, and knack method, proper technique with pelvic floor mobility. Pt motivated to participate in PT and agreeable to attempt recommendations.  ?  ?  ?  ?PATIENT EDUCATION:  ?Education details: [id number] ?Person educated: Patient ?Education method: Explanation, Demonstration, Tactile cues, Verbal cues, and Handouts ?Education comprehension: verbalized understanding and returned demonstration ?  ?  ?HOME EXERCISE PROGRAM: ?[id number] ?  ?ASSESSMENT: ?  ?CLINICAL IMPRESSION: ?Patient presents for first follow up since evaluation, reports no more leakage and has had several stressors that would have caused leakage in the past. Pt pleased with progress and requesting resources to continue postpartum strengthening and workouts and make sure she understands how to  coordinate breathing with lifting to prevent injury. Session focused on breathing mechanics and coordination with pelvic floor for strengthening exercises for core and hips with proper lifting mechanics. Pt benefited from mild cues for techniques but demonstrated improvement, HEP updated and reviewed with pt. Pt tolerated well and denied additional needs for PT at this time. PT educated pt on postpartum care if needed in the future or pregnancy PT care if needed with additional pregnancies if needed and if there are future needs pt would need new referral. Pt verbalized understanding. This will serve as pt's DC from PT.  ?  ?  ?OBJECTIVE IMPAIRMENTS decreased coordination, decreased endurance, decreased mobility, decreased strength, increased fascial restrictions, impaired flexibility, improper body mechanics, and postural dysfunction.  ?  ?ACTIVITY LIMITATIONS community activity.  ?  ?PERSONAL FACTORS 1 comorbidity: one vaginal birth with 2nd degree tearing  are also affecting patient's functional outcome.  ?  ?  ?REHAB POTENTIAL: Good ?  ?CLINICAL DECISION MAKING: Stable/uncomplicated ?  ?EVALUATION COMPLEXITY: Low ?  ?  ?GOALS: ?Goals reviewed with patient? Yes ?  ?SHORT TERM GOALS: Target date: 09/05/2021 ?  ?Pt to be I with HEP. ?Baseline: ?Goal status: MET ?  ?2.  Pt to demonstrate improved lifting mechanics of 15# without compensatory strategies for 10 reps without leakage for improved mechanics for childcare.  ?Baseline:  ?Goal status: MET ?  ?3.  Pt to demonstrate improved coordination of breathing and pelvic floor with functional tasks without leakage at least 50% of the time to decrease leakage.  ?Baseline:  ?Goal status: MET ?  ?  ?  ?LONG TERM GOALS: Target date: 11/07/2021 ?  ?Pt to be I with advanced HEP. ?Baseline:  ?Goal status: MET ?  ?2.  Pt to demonstrate improved lifting mechanics of 25# without compensatory strategies for 10 reps without leakage for improved mechanics for childcare.  ?Baseline:   ?Goal status: NOT MET ?  ?3.  Pt to demonstrate improved coordination of breathing and pelvic floor with functional tasks without leakage at least 75% of the time to decrease leakage.  ?Baseline:  ?Goal status: MET ?  ?4.  Pt to demonstrate at least 5/5 bil hip strength for improved pelvic stability and functional squats without leakage. ?  ?Baseline:  ?Goal status: MET ?  ?5.  Pt to demonstrate at least 5/5 pelvic floor strength for improved pelvic stability and decreased  strain at pelvic floor/ decrease leakage.  ?Baseline:  ?Goal status: NOT REASSESSED AS PT REPORTED NO FURTHER SYMPTOMS AND FOCUS OF SESSION STRENGTHENING ?  ?  ?  ?PLAN: ?PT FREQUENCY:  every other week ?  ?PT DURATION:  6 sessions ?  ?PLANNED INTERVENTIONS: Therapeutic exercises, Therapeutic activity, Neuromuscular re-education, Patient/Family education, Joint mobilization, Dry Needling, Spinal mobilization, Cryotherapy, Moist heat, Manual lymph drainage, scar mobilization, Taping, Biofeedback, and Manual therapy ?  ?PLAN FOR NEXT SESSION:  ? ? ? ?Stacy Gardner, PT, DPT ?04/26/235:08 PM  ? ? PHYSICAL THERAPY DISCHARGE SUMMARY ? ?Visits from Start of Care: 2 ? ?Current functional level related to goals / functional outcomes: ?Pleased with progress all STG met; internal pelvic floor not reassess as pt denied continued symptoms or needs at this time.  ?  ?Remaining deficits: ?Pt pleased with progress and reports she would like to be DC'd ?  ?Education / Equipment: ?HEP  ? ?Patient agrees to discharge. Patient goals were partially met. Patient is being discharged due to being pleased with the current functional level.  ? ?Stacy Gardner, PT, DPT ?05/10/234:32 PM  ? ?

## 2021-09-03 ENCOUNTER — Telehealth: Payer: Self-pay | Admitting: Obstetrics and Gynecology

## 2021-09-03 ENCOUNTER — Encounter: Payer: Self-pay | Admitting: Obstetrics and Gynecology

## 2021-09-03 DIAGNOSIS — N926 Irregular menstruation, unspecified: Secondary | ICD-10-CM

## 2021-09-03 NOTE — Telephone Encounter (Signed)
Please contact patient regarding her My Chart message regarding prolonged bleeding with her menstrual cycles.  ? ?I recommend a pelvic ultrasound and recheck appointment with me.  ? ?You may close the My Chart encounter.  ?

## 2021-09-04 ENCOUNTER — Other Ambulatory Visit: Payer: Self-pay

## 2021-09-04 DIAGNOSIS — N926 Irregular menstruation, unspecified: Secondary | ICD-10-CM

## 2021-09-04 NOTE — Telephone Encounter (Signed)
Encounter reviewed and closed.  

## 2021-09-04 NOTE — Telephone Encounter (Signed)
I did offer for patient to go to [facility] instead of this office.  ?

## 2021-09-04 NOTE — Telephone Encounter (Signed)
Correction:  Ultrasound is scheduled 09/05/21 at [facility]. ?

## 2021-09-04 NOTE — Telephone Encounter (Signed)
Patient is scheduled for u/s 09/11/21 at [facility]. ?

## 2021-09-04 NOTE — Telephone Encounter (Signed)
Ultrasound and visit scheduled for 10/11/21. ?

## 2021-09-04 NOTE — Telephone Encounter (Signed)
I messaged patient back in My Chart with Dr. Elza Rafter recommendation. I asked if I might have our appointment desk contact her to arrange u/s. Order has been placed. ?

## 2021-09-05 ENCOUNTER — Ambulatory Visit
Admission: RE | Admit: 2021-09-05 | Discharge: 2021-09-05 | Disposition: A | Discharge status: Home / Self Care | Payer: 59 | Source: Ambulatory Visit | Attending: Obstetrics and Gynecology | Admitting: Obstetrics and Gynecology

## 2021-09-05 DIAGNOSIS — N926 Irregular menstruation, unspecified: Secondary | ICD-10-CM

## 2021-09-06 ENCOUNTER — Other Ambulatory Visit: Payer: Self-pay | Admitting: *Deleted

## 2021-09-06 ENCOUNTER — Encounter: Payer: Self-pay | Admitting: *Deleted

## 2021-09-06 ENCOUNTER — Encounter: Payer: 59 | Admitting: Physical Therapy

## 2021-09-06 DIAGNOSIS — N926 Irregular menstruation, unspecified: Secondary | ICD-10-CM

## 2021-09-06 NOTE — Telephone Encounter (Signed)
Appointments please reach out to patient to schedule  sonohysterogram and possible endometrial biopsy. Orders placed.  ?

## 2021-09-07 NOTE — Telephone Encounter (Signed)
Dr.Silva I spoke with the appointment team and they have 09/20/21 and 09/27/21 both days at 12:00. They said you would have approve if you want patient to be seen due to time conflict with lunch. Please let me know. ?

## 2021-09-07 NOTE — Telephone Encounter (Signed)
Either 5/23 or 6/1 at 12:00 is ok for me. ?

## 2021-09-07 NOTE — Telephone Encounter (Signed)
Please see if the patient's sonohysterogram and possible endometrial biopsy can be moved up sooner.  ?If this is not possible at this office, then schedule the sonohysterogram at [facility] and make a follow up appointment her for the possible endometrial biopsy.  ? ?Please let the patient know what options are available.  ?

## 2021-09-17 ENCOUNTER — Other Ambulatory Visit: Payer: 59

## 2021-09-18 ENCOUNTER — Encounter: Payer: 59 | Admitting: Physical Therapy

## 2021-09-25 ENCOUNTER — Telehealth: Payer: Self-pay | Admitting: Obstetrics and Gynecology

## 2021-09-25 NOTE — Telephone Encounter (Signed)
Please contact patient regarding recommendation for lab follow up for lipids, complete metabolic profile, and CBC.   She cancelled her lab visit.   Please let me know if she will be doing these labs through her PCP.

## 2021-10-02 NOTE — Telephone Encounter (Signed)
Dr.Silva patient read my chart message and did not respond  "Last read by Hermina Staggers at 11:03 AM on 09/25/2021."

## 2021-10-02 NOTE — Telephone Encounter (Signed)
Ok to wait to hear from patient.  Encounter reviewed and closed.

## 2021-10-11 ENCOUNTER — Other Ambulatory Visit: Payer: 59

## 2021-10-11 ENCOUNTER — Other Ambulatory Visit: Payer: 59 | Admitting: Obstetrics and Gynecology

## 2021-10-16 ENCOUNTER — Other Ambulatory Visit: Payer: 59 | Admitting: Obstetrics and Gynecology

## 2021-10-16 ENCOUNTER — Other Ambulatory Visit: Payer: 59

## 2022-07-29 ENCOUNTER — Ambulatory Visit: Payer: 59 | Admitting: Obstetrics and Gynecology

## 2022-12-13 ENCOUNTER — Emergency Department (HOSPITAL_BASED_OUTPATIENT_CLINIC_OR_DEPARTMENT_OTHER): Payer: 59

## 2022-12-13 ENCOUNTER — Other Ambulatory Visit: Payer: Self-pay

## 2022-12-13 ENCOUNTER — Telehealth (HOSPITAL_BASED_OUTPATIENT_CLINIC_OR_DEPARTMENT_OTHER): Payer: Self-pay | Admitting: Emergency Medicine

## 2022-12-13 ENCOUNTER — Encounter (HOSPITAL_BASED_OUTPATIENT_CLINIC_OR_DEPARTMENT_OTHER): Payer: Self-pay

## 2022-12-13 ENCOUNTER — Emergency Department (HOSPITAL_BASED_OUTPATIENT_CLINIC_OR_DEPARTMENT_OTHER)
Admission: EM | Admit: 2022-12-13 | Discharge: 2022-12-13 | Disposition: A | Discharge status: Home / Self Care | Payer: 59 | Attending: Emergency Medicine | Admitting: Emergency Medicine

## 2022-12-13 DIAGNOSIS — K8020 Calculus of gallbladder without cholecystitis without obstruction: Secondary | ICD-10-CM | POA: Diagnosis not present

## 2022-12-13 DIAGNOSIS — R1011 Right upper quadrant pain: Secondary | ICD-10-CM

## 2022-12-13 LAB — COMPREHENSIVE METABOLIC PANEL
ALT: 38 U/L (ref 0–44)
AST: 52 U/L — ABNORMAL HIGH (ref 15–41)
Albumin: 4.9 g/dL (ref 3.5–5.0)
Alkaline Phosphatase: 97 U/L (ref 38–126)
Anion gap: 11 (ref 5–15)
BUN: 16 mg/dL (ref 6–20)
CO2: 30 mmol/L (ref 22–32)
Calcium: 10 mg/dL (ref 8.9–10.3)
Chloride: 101 mmol/L (ref 98–111)
Creatinine, Ser: 0.97 mg/dL (ref 0.44–1.00)
GFR, Estimated: >60 mL/min (ref >60)
Glucose, Bld: 112 mg/dL — ABNORMAL HIGH (ref 70–99)
Potassium: 3.7 mmol/L (ref 3.5–5.1)
Sodium: 142 mmol/L (ref 135–145)
Total Bilirubin: 0.3 mg/dL (ref 0.3–1.2)
Total Protein: 8.1 g/dL (ref 6.5–8.1)

## 2022-12-13 LAB — CBC WITH DIFFERENTIAL/PLATELET
Abs Immature Granulocytes: 0.02 10*3/uL (ref 0.00–0.07)
Basophils Absolute: 0.1 10*3/uL (ref 0.0–0.1)
Basophils Relative: 1 %
Eosinophils Absolute: 0.2 10*3/uL (ref 0.0–0.5)
Eosinophils Relative: 2 %
HCT: 43.5 % (ref 36.0–46.0)
Hemoglobin: 14 g/dL (ref 12.0–15.0)
Immature Granulocytes: 0 %
Lymphocytes Relative: 35 %
Lymphs Abs: 3.7 10*3/uL (ref 0.7–4.0)
MCH: 27 pg (ref 26.0–34.0)
MCHC: 32.2 g/dL (ref 30.0–36.0)
MCV: 84 fL (ref 80.0–100.0)
Monocytes Absolute: 0.8 10*3/uL (ref 0.1–1.0)
Monocytes Relative: 8 %
Neutro Abs: 5.9 10*3/uL (ref 1.7–7.7)
Neutrophils Relative %: 54 %
Platelets: 356 10*3/uL (ref 150–400)
RBC: 5.18 MIL/uL — ABNORMAL HIGH (ref 3.87–5.11)
RDW: 15 % (ref 11.5–15.5)
WBC: 10.6 10*3/uL — ABNORMAL HIGH (ref 4.0–10.5)
nRBC: 0 % (ref 0.0–0.2)

## 2022-12-13 LAB — URINALYSIS, ROUTINE W REFLEX MICROSCOPIC
Bacteria, UA: NONE SEEN
Bilirubin Urine: NEGATIVE
Glucose, UA: NEGATIVE mg/dL
Hgb urine dipstick: NEGATIVE
Ketones, ur: NEGATIVE mg/dL
Nitrite: NEGATIVE
Specific Gravity, Urine: 1.027 (ref 1.005–1.030)
pH: 6.5 (ref 5.0–8.0)

## 2022-12-13 LAB — LIPASE, BLOOD: Lipase: 54 U/L — ABNORMAL HIGH (ref 11–51)

## 2022-12-13 LAB — HCG, QUANTITATIVE, PREGNANCY: hCG, Beta Chain, Quant, S: <1 m[IU]/mL (ref <5)

## 2022-12-13 MED ORDER — SODIUM CHLORIDE 0.9 % IV BOLUS
1000.0000 mL | Freq: Once | INTRAVENOUS | Status: AC
  Administered 2022-12-13: 1000 mL via INTRAVENOUS

## 2022-12-13 MED ORDER — OXYCODONE-ACETAMINOPHEN 5-325 MG PO TABS: 1.0000 | ORAL_TABLET | Freq: Four times a day (QID) | ORAL | 0 refills | Status: DC | PRN

## 2022-12-13 MED ORDER — HYDROCODONE-ACETAMINOPHEN 5-325 MG PO TABS: 1.0000 | ORAL_TABLET | Freq: Four times a day (QID) | ORAL | 0 refills | Status: DC | PRN

## 2022-12-13 MED ORDER — IOHEXOL 300 MG/ML  SOLN
100.0000 mL | Freq: Once | INTRAMUSCULAR | Status: AC | PRN
  Administered 2022-12-13: 80 mL via INTRAVENOUS

## 2022-12-13 MED ORDER — MORPHINE SULFATE (PF) 4 MG/ML IV SOLN
4.0000 mg | Freq: Once | INTRAVENOUS | Status: AC
  Administered 2022-12-13: 4 mg via INTRAVENOUS
  Filled 2022-12-13: qty 1

## 2022-12-13 MED ORDER — ONDANSETRON HCL 4 MG/2ML IJ SOLN
4.0000 mg | Freq: Once | INTRAMUSCULAR | Status: AC
  Administered 2022-12-13: 4 mg via INTRAVENOUS
  Filled 2022-12-13: qty 2

## 2022-12-13 NOTE — Telephone Encounter (Signed)
[facility] is out of [facility] will send in oxycodone instead

## 2022-12-13 NOTE — Discharge Instructions (Signed)
Begin taking hydrocodone as prescribed as needed for pain.  Follow-up with general surgery regarding your gallstones.  The contact information for the [facility] surgery office has been provided in this discharge summary for you to call and make these arrangements.  Return to the emergency department in the meantime if you develop worsening pain, high fever, bloody stool or vomit, or for other new and concerning symptoms.

## 2022-12-13 NOTE — ED Triage Notes (Signed)
POV from home, A&O x 4, GCS 15, amb to room  Pt c/o bilateral upper abd pain, slight nausea. Pt sts that this happened a couple weeks ago and went to UC had XR and dx with gas. Sts pain got better but came back tonight. Had baby 3 months ago, no abd surgery hx.

## 2022-12-13 NOTE — Telephone Encounter (Signed)
[facility] and they do not have any hydrocodone and patient is requesting it be changed to something she can pick up.

## 2022-12-13 NOTE — ED Provider Notes (Signed)
[place] EMERGENCY DEPARTMENT AT [facility] Provider Note   CSN: [id number] Arrival date & time: 12/13/22  4098     History  Chief Complaint  Patient presents with   Abdominal Pain    Carolyn Jefferson is a 32 y.o. female.  Patient is a 32 year old female with history of childbirth 3 months ago, but no prior abdominal surgeries.  Patient presenting today with complaints of abdominal pain.  She describes a constant pain to the epigastric region and right upper quadrant that woke her from sleep.  She denies fevers or chills.  She does report some nausea, but has not vomited.  No diarrhea or constipation.  Pain is worse with palpation and movement with no alleviating factors.  The history is provided by the patient.       Home Medications Prior to Admission medications   Not on File      Allergies    Patient has no known allergies.    Review of Systems   Review of Systems  All other systems reviewed and are negative.   Physical Exam Updated Vital Signs BP (!) 127/96   Pulse (!) 56   Temp 97.9 F (36.6 C)   Resp 18   Ht 5\' 3"  (1.6 m)   Wt 63.5 kg   LMP 11/26/2022 (Exact Date)   SpO2 100%   BMI 24.80 kg/m  Physical Exam Vitals and nursing note reviewed.  Constitutional:      General: She is not in acute distress.    Appearance: She is well-developed. She is not diaphoretic.  HENT:     Head: Normocephalic and atraumatic.  Cardiovascular:     Rate and Rhythm: Normal rate and regular rhythm.     Heart sounds: No murmur heard.    No friction rub. No gallop.  Pulmonary:     Effort: Pulmonary effort is normal. No respiratory distress.     Breath sounds: Normal breath sounds. No wheezing.  Abdominal:     General: Bowel sounds are normal. There is no distension.     Palpations: Abdomen is soft.     Tenderness: There is abdominal tenderness in the right upper quadrant and epigastric area. There is no right CVA tenderness, left CVA tenderness, guarding  or rebound.  Musculoskeletal:        General: Normal range of motion.     Cervical back: Normal range of motion and neck supple.  Skin:    General: Skin is warm and dry.  Neurological:     General: No focal deficit present.     Mental Status: She is alert and oriented to person, place, and time.     ED Results / Procedures / Treatments   Labs (all labs ordered are listed, but only abnormal results are displayed) Labs Reviewed  COMPREHENSIVE METABOLIC PANEL  CBC WITH DIFFERENTIAL/PLATELET  LIPASE, BLOOD  HCG, QUANTITATIVE, PREGNANCY  URINALYSIS, ROUTINE W REFLEX MICROSCOPIC    EKG None  Radiology No results found.  Procedures Procedures    Medications Ordered in ED Medications  morphine (PF) 4 MG/ML injection 4 mg (has no administration in time range)  ondansetron (ZOFRAN) injection 4 mg (has no administration in time range)  sodium chloride 0.9 % bolus 1,000 mL (has no administration in time range)    ED Course/ Medical Decision Making/ A&P  Patient is a 32 year old female presenting with right upper quadrant/epigastric pain as described in the HPI.  Patient arrives here uncomfortable appearing, but afebrile and with stable vital signs.  She is tender to palpation in the epigastrium and right upper quadrant.  Workup initiated including CBC, CMP, lipase, and pregnancy test.  There is a trivial elevation of her white count and AST, but all other tests are unremarkable.  CT scan of the abdomen and pelvis obtained showing calcifications within the gallbladder, but no other acute findings.  There is no evidence for cholecystitis or biliary obstruction.  Patient given IV fluids along with morphine for pain and Zofran for nausea.  She is feeling considerably improved.  Nothing in the workup indicates that emergent cholecystectomy is necessary.  Patient to be discharged with pain medication and outpatient surgery follow-up.  Final Clinical Impression(s) / ED Diagnoses Final  diagnoses:  None    Rx / DC Orders ED Discharge Orders     None         Geoffery Lyons, MD 12/13/22 [phone]

## 2022-12-15 ENCOUNTER — Observation Stay (HOSPITAL_COMMUNITY)
Admission: EM | Admit: 2022-12-15 | Discharge: 2022-12-19 | Disposition: A | Discharge status: Home / Self Care | Payer: 59 | Attending: Internal Medicine | Admitting: Internal Medicine

## 2022-12-15 ENCOUNTER — Other Ambulatory Visit: Payer: Self-pay

## 2022-12-15 ENCOUNTER — Observation Stay (HOSPITAL_COMMUNITY): Payer: 59

## 2022-12-15 ENCOUNTER — Encounter (HOSPITAL_COMMUNITY): Payer: Self-pay

## 2022-12-15 ENCOUNTER — Emergency Department (HOSPITAL_COMMUNITY): Payer: 59

## 2022-12-15 DIAGNOSIS — K8021 Calculus of gallbladder without cholecystitis with obstruction: Principal | ICD-10-CM

## 2022-12-15 DIAGNOSIS — K8020 Calculus of gallbladder without cholecystitis without obstruction: Secondary | ICD-10-CM | POA: Insufficient documentation

## 2022-12-15 DIAGNOSIS — R748 Abnormal levels of other serum enzymes: Secondary | ICD-10-CM | POA: Diagnosis not present

## 2022-12-15 DIAGNOSIS — K8012 Calculus of gallbladder with acute and chronic cholecystitis without obstruction: Principal | ICD-10-CM | POA: Insufficient documentation

## 2022-12-15 DIAGNOSIS — R932 Abnormal findings on diagnostic imaging of liver and biliary tract: Secondary | ICD-10-CM | POA: Insufficient documentation

## 2022-12-15 DIAGNOSIS — K831 Obstruction of bile duct: Secondary | ICD-10-CM | POA: Diagnosis present

## 2022-12-15 DIAGNOSIS — K8050 Calculus of bile duct without cholangitis or cholecystitis without obstruction: Secondary | ICD-10-CM | POA: Diagnosis present

## 2022-12-15 DIAGNOSIS — R1011 Right upper quadrant pain: Secondary | ICD-10-CM | POA: Diagnosis present

## 2022-12-15 DIAGNOSIS — R7401 Elevation of levels of liver transaminase levels: Secondary | ICD-10-CM | POA: Insufficient documentation

## 2022-12-15 DIAGNOSIS — Z4659 Encounter for fitting and adjustment of other gastrointestinal appliance and device: Secondary | ICD-10-CM | POA: Diagnosis not present

## 2022-12-15 LAB — URINALYSIS, ROUTINE W REFLEX MICROSCOPIC
Bacteria, UA: NONE SEEN
Bilirubin Urine: NEGATIVE
Glucose, UA: NEGATIVE mg/dL
Ketones, ur: NEGATIVE mg/dL
Leukocytes,Ua: NEGATIVE
Nitrite: NEGATIVE
Protein, ur: NEGATIVE mg/dL
Specific Gravity, Urine: 1.002 — ABNORMAL LOW (ref 1.005–1.030)
pH: 7 (ref 5.0–8.0)

## 2022-12-15 LAB — CBC
HCT: 44.1 % (ref 36.0–46.0)
Hemoglobin: 13.6 g/dL (ref 12.0–15.0)
MCH: 26.6 pg (ref 26.0–34.0)
MCHC: 30.8 g/dL (ref 30.0–36.0)
MCV: 86.3 fL (ref 80.0–100.0)
Platelets: 308 K/uL (ref 150–400)
RBC: 5.11 MIL/uL (ref 3.87–5.11)
RDW: 15.2 % (ref 11.5–15.5)
WBC: 8 K/uL (ref 4.0–10.5)
nRBC: 0 % (ref 0.0–0.2)

## 2022-12-15 LAB — HCG, SERUM, QUALITATIVE: Preg, Serum: NEGATIVE

## 2022-12-15 LAB — COMPREHENSIVE METABOLIC PANEL WITH GFR
ALT: 649 U/L — ABNORMAL HIGH (ref 0–44)
AST: 401 U/L — ABNORMAL HIGH (ref 15–41)
Albumin: 4.2 g/dL (ref 3.5–5.0)
Alkaline Phosphatase: 284 U/L — ABNORMAL HIGH (ref 38–126)
Anion gap: 10 (ref 5–15)
BUN: 6 mg/dL (ref 6–20)
CO2: 26 mmol/L (ref 22–32)
Calcium: 9.7 mg/dL (ref 8.9–10.3)
Chloride: 103 mmol/L (ref 98–111)
Creatinine, Ser: 0.81 mg/dL (ref 0.44–1.00)
GFR, Estimated: >60 mL/min
Glucose, Bld: 110 mg/dL — ABNORMAL HIGH (ref 70–99)
Potassium: 3.7 mmol/L (ref 3.5–5.1)
Sodium: 139 mmol/L (ref 135–145)
Total Bilirubin: 3.5 mg/dL — ABNORMAL HIGH (ref 0.3–1.2)
Total Protein: 7.8 g/dL (ref 6.5–8.1)

## 2022-12-15 LAB — LIPASE, BLOOD: Lipase: 38 U/L (ref 11–51)

## 2022-12-15 MED ORDER — SODIUM CHLORIDE 0.9% FLUSH
3.0000 mL | Freq: Two times a day (BID) | INTRAVENOUS | Status: DC
  Administered 2022-12-16 – 2022-12-18 (×6): 3 mL via INTRAVENOUS

## 2022-12-15 MED ORDER — GADOBUTROL 1 MMOL/ML IV SOLN
6.0000 mL | Freq: Once | INTRAVENOUS | Status: AC | PRN
  Administered 2022-12-15: 6 mL via INTRAVENOUS

## 2022-12-15 MED ORDER — ACETAMINOPHEN 650 MG RE SUPP: 650.0000 mg | Freq: Four times a day (QID) | RECTAL | Status: DC | PRN

## 2022-12-15 MED ORDER — POLYETHYLENE GLYCOL 3350 17 G PO PACK: 17.0000 g | PACK | Freq: Every day | ORAL | Status: DC | PRN

## 2022-12-15 MED ORDER — OXYCODONE-ACETAMINOPHEN 5-325 MG PO TABS: 1.0000 | ORAL_TABLET | Freq: Four times a day (QID) | ORAL | Status: DC | PRN

## 2022-12-15 MED ORDER — ONDANSETRON 4 MG PO TBDP
4.0000 mg | ORAL_TABLET | Freq: Once | ORAL | Status: AC | PRN
  Administered 2022-12-15: 4 mg via ORAL
  Filled 2022-12-15: qty 1

## 2022-12-15 MED ORDER — ACETAMINOPHEN 325 MG PO TABS: 650.0000 mg | ORAL_TABLET | Freq: Four times a day (QID) | ORAL | Status: DC | PRN

## 2022-12-15 NOTE — ED Provider Notes (Signed)
[place] EMERGENCY DEPARTMENT AT [facility] Provider Note   CSN: [id number] Arrival date & time: 12/15/22  1215     History  Chief Complaint  Patient presents with   Abdominal Pain    Carolyn Jefferson is a 32 y.o. female.  Patient with no past history of abdominal surgeries, childbirth about 3 months ago --presents to the emergency department for ongoing right upper quadrant pain.  Patient had a severe attack of right upper quadrant pain with radiation to her back about 3 days ago.  She presented to the emergency department at [facility] and had a CT scan done.  Labs at that time are reassuring.  CT scan showed gallstones without signs of cholecystitis.  Patient was discharged home with pain medication.  Her pain has been waxing and waning, worse again this morning around 10 AM.  Last night her husband remarked that her skin looked a little yellow and patient thought that this might have been the case today.  Her urine has been very dark.       Home Medications Prior to Admission medications   Medication Sig Start Date End Date Taking? Authorizing Provider  HYDROcodone-acetaminophen (NORCO) 5-325 MG tablet Take 1 tablet by mouth every 6 (six) hours as needed. 12/13/22   Gwyneth Sprout, MD  oxyCODONE-acetaminophen (PERCOCET/ROXICET) 5-325 MG tablet Take 1 tablet by mouth every 6 (six) hours as needed for severe pain. 12/13/22   Gwyneth Sprout, MD      Allergies    Patient has no known allergies.    Review of Systems   Review of Systems  Physical Exam Updated Vital Signs BP (!) 141/73   Pulse 65   Temp 98 F (36.7 C) (Oral)   Resp 16   LMP 11/26/2022 (Exact Date)   SpO2 100%  Physical Exam Vitals and nursing note reviewed.  Constitutional:      General: She is not in acute distress.    Appearance: She is well-developed.  HENT:     Head: Normocephalic and atraumatic.     Right Ear: External ear normal.     Left Ear: External ear normal.     Nose:  Nose normal.  Eyes:     General: Lids are normal. Scleral icterus present.     Conjunctiva/sclera: Conjunctivae normal.     Comments: Mild scleral icterus  Cardiovascular:     Rate and Rhythm: Normal rate and regular rhythm.     Heart sounds: No murmur heard. Pulmonary:     Effort: No respiratory distress.     Breath sounds: No wheezing, rhonchi or rales.  Abdominal:     Palpations: Abdomen is soft.     Tenderness: There is abdominal tenderness in the right upper quadrant. There is no guarding or rebound.  Musculoskeletal:     Cervical back: Normal range of motion and neck supple.     Right lower leg: No edema.     Left lower leg: No edema.  Skin:    General: Skin is warm and dry.     Findings: No rash.  Neurological:     General: No focal deficit present.     Mental Status: She is alert. Mental status is at baseline.     Motor: No weakness.  Psychiatric:        Mood and Affect: Mood normal.     ED Results / Procedures / Treatments   Labs (all labs ordered are listed, but only abnormal results are displayed) Labs Reviewed  COMPREHENSIVE  METABOLIC PANEL - Abnormal; Notable for the following components:      Result Value   Glucose, Bld 110 (*)    AST 401 (*)    ALT 649 (*)    Alkaline Phosphatase 284 (*)    Total Bilirubin 3.5 (*)    All other components within normal limits  URINALYSIS, ROUTINE W REFLEX MICROSCOPIC - Abnormal; Notable for the following components:   Specific Gravity, Urine 1.002 (*)    Hgb urine dipstick SMALL (*)    All other components within normal limits  LIPASE, BLOOD  CBC  HCG, SERUM, QUALITATIVE    EKG None  Radiology US Abdomen Limited RUQ (LIVER/GB)  Result Date: 12/15/2022 CLINICAL DATA:  Right upper quadrant pain EXAM: ULTRASOUND ABDOMEN LIMITED RIGHT UPPER QUADRANT COMPARISON:  CT AP from 12/13/2022 FINDINGS: Gallbladder: Scattered shadowing stones identified within the dependent portion of the gallbladder which measure up to 5 mm.  No gallbladder wall thickening, gallbladder sludge or pericholecystic fluid. Positive sonographic Murphy's sign reported by sonographer. Common bile duct: Diameter: 4.3 mm Liver: No focal lesion identified. Within normal limits in parenchymal echogenicity. Portal vein is patent on color Doppler imaging with normal direction of blood flow towards the liver. Other: None. IMPRESSION: Cholelithiasis. Positive sonographic Murphy's sign reported by sonographer. No gallbladder wall thickening, gallbladder sludge or pericholecystic fluid. Electronically Signed   By: Signa Kell M.D.   On: 12/15/2022 14:16    Procedures Procedures    Medications Ordered in ED Medications  ondansetron (ZOFRAN-ODT) disintegrating tablet 4 mg (4 mg Oral Given 12/15/22 1244)    ED Course/ Medical Decision Making/ A&P    Patient seen and examined. History obtained directly from patient. Work-up including labs, imaging, EKG ordered in triage, if performed, were reviewed.    Labs/EKG: Independently reviewed and interpreted.  This included: CBC unremarkable; UA unremarkable.  Awaiting CMP and lipase.  Imaging: Right upper quadrant ultrasound ordered.  Reviewed CT results from previous ED visit.  Medications/Fluids: Symptoms currently reasonably controlled as patient took medication prior to arrival.  Will continue to monitor.  Most recent vital signs reviewed and are as follows: BP (!) 141/73   Pulse 65   Temp 98 F (36.7 C) (Oral)   Resp 16   LMP 11/26/2022 (Exact Date)   SpO2 100%   Initial impression: RUQ pain, concern for biliary obstruction however patient looks well.   3:19 PM Reassessment performed. Patient appears stable.  Pain remains controlled.  Labs personally reviewed and interpreted including: CBC unremarkable; CMP transaminases elevated with AST 401, ALT 649, alkaline phosphatase 284 with total bilirubin 3.5, normal electrolytes and kidney function; lipase normal at 38.  Imaging personally  visualized and interpreted including: Ultrasound showing cholelithiasis, common bile duct 4 mm.  Reviewed pertinent lab work and imaging with patient at bedside. Questions answered.   Most current vital signs reviewed and are as follows: BP (!) 141/73   Pulse 65   Temp 98 F (36.7 C) (Oral)   Resp 16   LMP 11/26/2022 (Exact Date)   SpO2 100%   I discussed case with Dr. Dossie Der.  He has reviewed lab workup results.  Requests medical admission with GI evaluation.  They will consult.  Signout to [employer] at shift change. PCP is with [facility]. Will speak with on-call [facility] first.  Medical Decision Making Amount and/or Complexity of Data Reviewed Labs: ordered. Radiology: ordered.  Risk Prescription drug management.   Admit for gallstones with potential biliary duct obstruction.        Final Clinical Impression(s) / ED Diagnoses Final diagnoses:  Calculus of gallbladder with biliary obstruction but without cholecystitis    Rx / DC Orders ED Discharge Orders     None         Renne Crigler, PA-C 12/15/22 1527    Arby Barrette, MD 12/18/22 [phone]

## 2022-12-15 NOTE — ED Provider Notes (Signed)
Accepted handoff at shift change from [facility]. Please see prior provider note for more detail.   Briefly: Patient is 32 y.o. female who presented to ED for RUQ abdominal pain. She had positive sonographic Murphy's sign, elevated LFTs and bilirubin. Concern for choledocholithiasis. Discussed with surgery who wanted GI consult, admission to medicine. Pt stable.   [name] so consulted [facility] which is pending.   1545 - Discussed with [facility], Dr. Levora Angel, who recommended MRCP and they will see in the hospital tomorrow.   1555 - Case discussed with Dr. Alinda Money, hospitalist, who accepts admission.  1600 - Patient updated on plan for MRCP and admission to the hospital.  Surgery has evaluated, GI will see tomorrow. She remains comfortable and stable.     Richardson Dopp 12/15/22 1600    Glyn Ade, MD 12/15/22 1606

## 2022-12-15 NOTE — Consult Note (Signed)
Consulting Physician: Hyman Hopes Yuma Blucher  Referring Provider: Dr. Alinda Money  Chief Complaint: Abdominal pain  Reason for Consult: Choledocholtihiasis   Subjective   HPI: Carolyn Jefferson is an 32 y.o. female who is here for abdominal pain.  She had a baby back in May and now has two babies under the age of two.  Thinking back, she has had some episodes of abdominal pain but never as severe as recently.  A few days ago she came into the ER with abdominal pain and was sent home with outpatient follow up for cholecystitis.  She has not felt better and now returns to the ER.  Now it looks like she may have choledocholithiasis based on labs and initial imaging.  Gastroenterology has ordered a MRCP and may need to do an ERCP.  Surgery was consulted for cholecystectomy prior to discharge.    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 She has never used smokeless tobacco. She reports that she does not currently use alcohol. She reports that she does not use drugs.  Allergies: No Known Allergies  Medications: Current Outpatient Medications  Medication Instructions   Famotidine-Ca Carb-Mag Hydrox (DUAL ACTION COMPLETE PO) 1 tablet, Oral, Every 4 hours PRN   oxyCODONE-acetaminophen (PERCOCET/ROXICET) 5-325 MG tablet 1 tablet, Oral, Every 6 hours PRN     ROS - all of the below systems have been reviewed with the patient and positives are indicated with bold text General: chills, fever or night sweats Eyes: blurry vision or double vision ENT: epistaxis or sore throat Allergy/Immunology: itchy/watery eyes or nasal congestion Hematologic/Lymphatic: bleeding problems, blood clots or swollen lymph nodes Endocrine: temperature intolerance or unexpected weight changes Breast: new or changing breast lumps or nipple discharge Resp: cough, shortness of breath, or wheezing CV: chest pain or dyspnea on exertion GI: as per HPI GU: dysuria, trouble voiding, or hematuria MSK: joint pain or joint stiffness Neuro: TIA or stroke symptoms Derm: pruritus and skin lesion changes Psych: anxiety and depression  Objective   PE Blood pressure (!) 141/73, pulse 65, temperature 98 F (36.7 C), temperature source Oral, resp. rate 16, last menstrual period 11/26/2022, SpO2 100%. Constitutional: NAD; conversant; no deformities Eyes: Moist conjunctiva; no lid lag; anicteric; PERRL Neck: Trachea midline; no thyromegaly Lungs: Normal respiratory effort; no tactile fremitus CV: RRR; no palpable thrills; no pitting edema GI: Abd Soft, mild RUQ tenderness; no palpable hepatosplenomegaly MSK: Normal range of motion of extremities; no clubbing/cyanosis Psychiatric: Appropriate affect; alert and oriented x3 Lymphatic: No palpable cervical or axillary lymphadenopathy  Results for orders placed or performed during the hospital encounter of 12/15/22 (from the past 24 hour(s))  Lipase, blood  Status: None   Collection Time: 12/15/22 12:42 PM  Result Value Ref Range   Lipase 38 11 - 51 U/L  Comprehensive metabolic panel     Status: Abnormal   Collection Time: 12/15/22 12:42 PM  Result Value Ref Range   Sodium 139 135 - 145 mmol/L   Potassium 3.7 3.5 - 5.1 mmol/L   Chloride 103 98 - 111 mmol/L   CO2 26 22 - 32 mmol/L   Glucose, Bld 110 (H) 70 - 99 mg/dL    BUN 6 6 - 20 mg/dL   Creatinine, Ser 1.47 0.44 - 1.00 mg/dL   Calcium 9.7 8.9 - 82.9 mg/dL   Total Protein 7.8 6.5 - 8.1 g/dL   Albumin 4.2 3.5 - 5.0 g/dL   AST 562 (H) 15 - 41 U/L   ALT 649 (H) 0 - 44 U/L   Alkaline Phosphatase 284 (H) 38 - 126 U/L   Total Bilirubin 3.5 (H) 0.3 - 1.2 mg/dL   GFR, Estimated >13 >08 mL/min   Anion gap 10 5 - 15  CBC     Status: None   Collection Time: 12/15/22 12:42 PM  Result Value Ref Range   WBC 8.0 4.0 - 10.5 K/uL   RBC 5.11 3.87 - 5.11 MIL/uL   Hemoglobin 13.6 12.0 - 15.0 g/dL   HCT 65.7 84.6 - 96.2 %   MCV 86.3 80.0 - 100.0 fL   MCH 26.6 26.0 - 34.0 pg   MCHC 30.8 30.0 - 36.0 g/dL   RDW 95.2 84.1 - 32.4 %   Platelets 308 150 - 400 K/uL   nRBC 0.0 0.0 - 0.2 %  Urinalysis, Routine w reflex microscopic -Urine, Clean Catch     Status: Abnormal   Collection Time: 12/15/22 12:42 PM  Result Value Ref Range   Color, Urine YELLOW YELLOW   APPearance CLEAR CLEAR   Specific Gravity, Urine 1.002 (L) 1.005 - 1.030   pH 7.0 5.0 - 8.0   Glucose, UA NEGATIVE NEGATIVE mg/dL   Hgb urine dipstick SMALL (A) NEGATIVE   Bilirubin Urine NEGATIVE NEGATIVE   Ketones, ur NEGATIVE NEGATIVE mg/dL   Protein, ur NEGATIVE NEGATIVE mg/dL   Nitrite NEGATIVE NEGATIVE   Leukocytes,Ua NEGATIVE NEGATIVE   RBC / HPF 0-5 0 - 5 RBC/hpf   WBC, UA 0-5 0 - 5 WBC/hpf   Bacteria, UA NONE SEEN NONE SEEN   Squamous Epithelial / HPF 0-5 0 - 5 /HPF  hCG, serum, qualitative     Status: None   Collection Time: 12/15/22 12:42 PM  Result Value Ref Range   Preg, Serum NEGATIVE NEGATIVE     Imaging Orders         US Abdomen Limited RUQ (LIVER/GB)     Cholelithiasis. Positive sonographic Murphy's sign reported by sonographer. No gallbladder wall thickening, gallbladder sludge or pericholecystic fluid.       MR ABDOMEN MRCP W WO CONTAST    Pending   Assessment and Plan   Carolyn Jefferson is an 32 y.o. female with abdominal pain and workup concerning for  choledocholithiasis.  Gastroenterology was consulted and an MRCP has been ordered.  She may need an ERCP, or her LFTs may trend down on their own.  Once the common bile duct is investigated and or cleared I recommend laparoscopic cholecystectomy. Surgery team will follow up after MRCP and if needed ERCP are completed.      ICD-10-CM   1. Calculus of gallbladder with biliary obstruction but without cholecystitis  K80.21  Quentin Ore, MD  [facility] Use [email or web address] to contact on call provider  New Patient Billing: [id number] - High MDM

## 2022-12-15 NOTE — ED Notes (Signed)
ED TO INPATIENT HANDOFF REPORT  ED Nurse Name and Phone #:  Dwana Melena [phone]  S Name/Age/Gender Shade Flood 32 y.o. female Room/Bed: H021C/H021C  Code Status   Code Status: Full Code  Home/SNF/Other Home Patient oriented to: self, place, time, and situation Is this baseline? Yes   Triage Complete: Triage complete  Chief Complaint Cholestasis [K83.1]  Triage Note Pt reports "gallbladder attack" two weeks ago, seen at ER for same. Given gen surgery for follow up but pt not feeling any better. Pt took oxycodone at 1045 today. Some nausea   Allergies No Known Allergies  Level of Care/Admitting Diagnosis ED Disposition     ED Disposition  Admit   Condition  --   Comment  Hospital Area: [facility] [[id number]]  Level of Care: Med-Surg [16]  May place patient in observation at [facility] or [facility] if equivalent level of care is available:: No  Covid Evaluation: Asymptomatic - no recent exposure (last 10 days) testing not required  Diagnosis: Cholestasis [016010]  Admitting Physician: SHARLET, STEINBRECHER [[id number]]  Attending Physician: Synetta Fail [[id number]]          B Medical/Surgery History Past Medical History:  Diagnosis Date   Depression    while on OCPs   LGSIL on Pap smear of cervix 09/27/2016   Low vitamin D level    Situational stress 2018   Past Surgical History:  Procedure Laterality Date   HYMENECTOMY N/A 09/05/2015   Procedure: HYMENECTOMY excision of hymenal remnant ;  Surgeon: Patton Salles, MD;  Location: [facility];  Service: Gynecology;  Laterality: N/A;   NO PAST SURGERIES       A IV Location/Drains/Wounds Patient Lines/Drains/Airways Status     Active Line/Drains/Airways     None            Intake/Output Last 24 hours No intake or output data in the 24 hours ending 12/15/22 1605  Labs/Imaging Results for orders placed or performed during the hospital encounter of 12/15/22 (from the  past 48 hour(s))  Lipase, blood     Status: None   Collection Time: 12/15/22 12:42 PM  Result Value Ref Range   Lipase 38 11 - 51 U/L    Comment: Performed at [facility], [address], [place], [place] [address]  Comprehensive metabolic panel     Status: Abnormal   Collection Time: 12/15/22 12:42 PM  Result Value Ref Range   Sodium 139 135 - 145 mmol/L   Potassium 3.7 3.5 - 5.1 mmol/L   Chloride 103 98 - 111 mmol/L   CO2 26 22 - 32 mmol/L   Glucose, Bld 110 (H) 70 - 99 mg/dL    Comment: Glucose reference range applies only to samples taken after fasting for at least 8 hours.   BUN 6 6 - 20 mg/dL   Creatinine, Ser 3.76 0.44 - 1.00 mg/dL   Calcium 9.7 8.9 - 28.3 mg/dL   Total Protein 7.8 6.5 - 8.1 g/dL   Albumin 4.2 3.5 - 5.0 g/dL   AST 151 (H) 15 - 41 U/L   ALT 649 (H) 0 - 44 U/L   Alkaline Phosphatase 284 (H) 38 - 126 U/L   Total Bilirubin 3.5 (H) 0.3 - 1.2 mg/dL   GFR, Estimated >76 >16 mL/min    Comment: (NOTE) Calculated using the CKD-EPI Creatinine Equation (2021)    Anion gap 10 5 - 15    Comment: Performed at [facility], [address], [place], [place] [address]  CBC     Status: None   Collection Time: 12/15/22 12:42 PM  Result Value Ref Range   WBC 8.0 4.0 - 10.5 K/uL   RBC 5.11 3.87 - 5.11 MIL/uL   Hemoglobin 13.6 12.0 - 15.0 g/dL   HCT 60.4 54.0 - 98.1 %   MCV 86.3 80.0 - 100.0 fL   MCH 26.6 26.0 - 34.0 pg   MCHC 30.8 30.0 - 36.0 g/dL   RDW 19.1 47.8 - 29.5 %   Platelets 308 150 - 400 K/uL   nRBC 0.0 0.0 - 0.2 %    Comment: Performed at [facility], [address], [place], [place] [address]  Urinalysis, Routine w reflex microscopic -Urine, Clean Catch     Status: Abnormal   Collection Time: 12/15/22 12:42 PM  Result Value Ref Range   Color, Urine YELLOW YELLOW   APPearance CLEAR CLEAR   Specific Gravity, Urine 1.002 (L) 1.005 - 1.030   pH 7.0 5.0 - 8.0   Glucose, UA NEGATIVE NEGATIVE mg/dL   Hgb urine dipstick SMALL (A) NEGATIVE    Bilirubin Urine NEGATIVE NEGATIVE   Ketones, ur NEGATIVE NEGATIVE mg/dL   Protein, ur NEGATIVE NEGATIVE mg/dL   Nitrite NEGATIVE NEGATIVE   Leukocytes,Ua NEGATIVE NEGATIVE   RBC / HPF 0-5 0 - 5 RBC/hpf   WBC, UA 0-5 0 - 5 WBC/hpf   Bacteria, UA NONE SEEN NONE SEEN   Squamous Epithelial / HPF 0-5 0 - 5 /HPF    Comment: Performed at [facility], [address], [place], [place] [address]  hCG, serum, qualitative     Status: None   Collection Time: 12/15/22 12:42 PM  Result Value Ref Range   Preg, Serum NEGATIVE NEGATIVE    Comment:        THE SENSITIVITY OF THIS METHODOLOGY IS >10 mIU/mL. Performed at [facility], [address], [place], [place] [address]    US Abdomen Limited RUQ (LIVER/GB)  Result Date: 12/15/2022 CLINICAL DATA:  Right upper quadrant pain EXAM: ULTRASOUND ABDOMEN LIMITED RIGHT UPPER QUADRANT COMPARISON:  CT AP from 12/13/2022 FINDINGS: Gallbladder: Scattered shadowing stones identified within the dependent portion of the gallbladder which measure up to 5 mm. No gallbladder wall thickening, gallbladder sludge or pericholecystic fluid. Positive sonographic Murphy's sign reported by sonographer. Common bile duct: Diameter: 4.3 mm Liver: No focal lesion identified. Within normal limits in parenchymal echogenicity. Portal vein is patent on color Doppler imaging with normal direction of blood flow towards the liver. Other: None. IMPRESSION: Cholelithiasis. Positive sonographic Murphy's sign reported by sonographer. No gallbladder wall thickening, gallbladder sludge or pericholecystic fluid. Electronically Signed   By: Signa Kell M.D.   On: 12/15/2022 14:16    Pending Labs Unresulted Labs (From admission, onward)     Start     Ordered   12/16/22 0500  Comprehensive metabolic panel  Tomorrow morning,   R        12/15/22 1557   12/16/22 0500  CBC  Tomorrow morning,   R        12/15/22 1557   12/15/22 1554  HIV Antibody (routine testing w rflx)  (HIV  Antibody (Routine testing w reflex) panel)  Once,   R        12/15/22 1557            Vitals/Pain Today's Vitals   12/15/22 1240 12/15/22 1241  BP:  (!) 141/73  Pulse:  65  Resp:  16  Temp:  98 F (36.7 C)  TempSrc:  Oral  SpO2:  100%  PainSc: 4      Isolation Precautions No active isolations  Medications Medications  sodium chloride flush (NS) 0.9 % injection 3 mL (has no administration in time range)  acetaminophen (TYLENOL) tablet 650 mg (has no administration in time range)    Or  acetaminophen (TYLENOL) suppository 650 mg (has no administration in time range)  polyethylene glycol (MIRALAX / GLYCOLAX) packet 17 g (has no administration in time range)  ondansetron (ZOFRAN-ODT) disintegrating tablet 4 mg (4 mg Oral Given 12/15/22 1244)    Mobility walks     Focused Assessments    R Recommendations: See Admitting Provider Note  Report given to:   Additional Notes:

## 2022-12-15 NOTE — H&P (Signed)
History and Physical   Carolyn Jefferson NGE:[id number] DOB: [birth year] DOA: 12/15/2022  PCP: [facility]   Patient coming from: Home  Chief Complaint: Abdominal Pain  HPI: Carolyn Jefferson is a 32 y.o. female with no significant past medical history presenting with ongoing abdominal pain.  Patient has had right upper quadrant abdominal pain starting 3 days ago.  On that day she had sudden and severe attack of pain and [facility].  There she had reassuring labs and CT scan showing gallstones but no cholecystitis.  Patient was discharged with pain medication.  She is continue to have intermittent abdominal pain that was significant increase this morning.  Also is reporting some dark urine and possible jaundice.  Denies fevers, chills, chest pain, shortness of breath, constipation, diarrhea, nausea, vomiting.  ED Course: Vital signs in the ED notable for blood pressure in the 140s systolic.  Lab workup included CMP with glucose 110, AST 4 1, ALT 649, alk phos 284, T. bili 3.5.  CBC within normal limits.  Lipase normal.  Urinalysis with hemoglobin only.  CT from 8/16 showed no acute abnormality and showed gallstones and small left renal stone.  Ultrasound of the right upper quadrant from today showed gallstones, positive sonographic Murphy sign, no evidence of gallbladder wall thickening, no sludge, no pericholecystic fluid.  CBD 4.3 mm.  General surgery was consulted and will see the patient recommending GI evaluation.  GI was consulted and recommended MRCP and will see the patient.  Review of Systems: As per HPI otherwise all other systems reviewed and are negative.  Past Medical History:  Diagnosis Date   Depression    while on OCPs   LGSIL on Pap smear of cervix 09/27/2016   Low vitamin D level    Situational stress 2018    Past Surgical History:  Procedure Laterality Date   HYMENECTOMY N/A 09/05/2015   Procedure:  HYMENECTOMY excision of hymenal remnant ;  Surgeon: Patton Salles, MD;  Location: [facility];  Service: Gynecology;  Laterality: N/A;   NO PAST SURGERIES      Social History  reports that she has never smoked. She has never used smokeless tobacco. She reports that she does not currently use alcohol. She reports that she does not use drugs.  No Known Allergies  Family History  Problem Relation Age of Onset   Ulcerative colitis Paternal Grandfather    Stroke Paternal Grandfather    Cancer Other        colon ca   Cancer Other        colon ca   Diabetes Maternal Grandfather   Reviewed on admission  Prior to Admission medications   Medication Sig Start Date End Date Taking? Authorizing Provider  Famotidine-Ca Carb-Mag Hydrox (DUAL ACTION COMPLETE PO) Take 1 tablet by mouth every 4 (four) hours as needed (pain).   Yes [provider]  oxyCODONE-acetaminophen (PERCOCET/ROXICET) 5-325 MG tablet Take 1 tablet by mouth every 6 (six) hours as needed for severe pain. 12/13/22  Yes Gwyneth Sprout, MD    Physical Exam: Vitals:   12/15/22 1241  BP: (!) 141/73  Pulse: 65  Resp: 16  Temp: 98 F (36.7 C)  TempSrc: Oral  SpO2: 100%    Physical Exam Constitutional:      General: She is not in acute distress.    Appearance: Normal appearance.  HENT:     Head: Normocephalic and atraumatic.     Mouth/Throat:  Mouth: Mucous membranes are moist.     Pharynx: Oropharynx is clear.  Eyes:     General: Scleral icterus present.     Extraocular Movements: Extraocular movements intact.     Pupils: Pupils are equal, round, and reactive to light.  Cardiovascular:     Rate and Rhythm: Normal rate and regular rhythm.     Pulses: Normal pulses.     Heart sounds: Normal heart sounds.  Pulmonary:     Effort: Pulmonary effort is normal. No respiratory distress.     Breath sounds: Normal breath sounds.  Abdominal:     General: Bowel sounds are normal. There is no distension.      Palpations: Abdomen is soft.     Tenderness: There is abdominal tenderness.  Musculoskeletal:        General: No swelling or deformity.  Skin:    General: Skin is warm and dry.  Neurological:     General: No focal deficit present.     Mental Status: Mental status is at baseline.    Labs on Admission: I have personally reviewed following labs and imaging studies  CBC: Recent Labs  Lab 12/13/22 0440 12/15/22 1242  WBC 10.6* 8.0  NEUTROABS 5.9  --   HGB 14.0 13.6  HCT 43.5 44.1  MCV 84.0 86.3  PLT 356 308    Basic Metabolic Panel: Recent Labs  Lab 12/13/22 0440 12/15/22 1242  NA 142 139  K 3.7 3.7  CL 101 103  CO2 30 26  GLUCOSE 112* 110*  BUN 16 6  CREATININE 0.97 0.81  CALCIUM 10.0 9.7    GFR: Estimated Creatinine Clearance: 91.1 mL/min (by C-G formula based on SCr of 0.81 mg/dL).  Liver Function Tests: Recent Labs  Lab 12/13/22 0440 12/15/22 1242  AST 52* 401*  ALT 38 649*  ALKPHOS 97 284*  BILITOT 0.3 3.5*  PROT 8.1 7.8  ALBUMIN 4.9 4.2    Urine analysis:    Component Value Date/Time   COLORURINE YELLOW 12/15/2022 1242   APPEARANCEUR CLEAR 12/15/2022 1242   LABSPEC 1.002 (L) 12/15/2022 1242   PHURINE 7.0 12/15/2022 1242   GLUCOSEU NEGATIVE 12/15/2022 1242   HGBUR SMALL (A) 12/15/2022 1242   BILIRUBINUR NEGATIVE 12/15/2022 1242   BILIRUBINUR n 08/18/2015 1443   KETONESUR NEGATIVE 12/15/2022 1242   PROTEINUR NEGATIVE 12/15/2022 1242   UROBILINOGEN negative 08/18/2015 1443   NITRITE NEGATIVE 12/15/2022 1242   LEUKOCYTESUR NEGATIVE 12/15/2022 1242    Radiological Exams on Admission: US Abdomen Limited RUQ (LIVER/GB)  Result Date: 12/15/2022 CLINICAL DATA:  Right upper quadrant pain EXAM: ULTRASOUND ABDOMEN LIMITED RIGHT UPPER QUADRANT COMPARISON:  CT AP from 12/13/2022 FINDINGS: Gallbladder: Scattered shadowing stones identified within the dependent portion of the gallbladder which measure up to 5 mm. No gallbladder wall thickening,  gallbladder sludge or pericholecystic fluid. Positive sonographic Murphy's sign reported by sonographer. Common bile duct: Diameter: 4.3 mm Liver: No focal lesion identified. Within normal limits in parenchymal echogenicity. Portal vein is patent on color Doppler imaging with normal direction of blood flow towards the liver. Other: None. IMPRESSION: Cholelithiasis. Positive sonographic Murphy's sign reported by sonographer. No gallbladder wall thickening, gallbladder sludge or pericholecystic fluid. Electronically Signed   By: Signa Kell M.D.   On: 12/15/2022 14:16    EKG: Not performed in emergency department  Assessment/Plan Active Problems:   Cholelithiasis   Transaminitis   Cholestasis   Cholestasis Transaminitis Cholelithiasis Rule out choledocholithiasis/obstructive etiology > Patient presenting with ongoing right upper quadrant abdominal  pain that has been intermittent.  Now with transaminitis.  Ultrasound with gallstones, no evidence of inflammation but positive sonographic Murphy sign. > Concern for cholestasis and possible choledocholithiasis.  General surgery and gastroenterology consulted in the ED and will see the patient, recommend starting with MRCP to further evaluate for obstructive etiology. - Monitor on MedSurg unit - Appreciate general surgery and gastroenterology recommendations - As needed pain control - MRCP - Supportive care - Trend CMP  DVT prophylaxis: None at this time as patient is ambulatory and may need procedural intervention Code Status:   Full Family Communication:  None on admission  Disposition Plan:   Patient is from:  Home  Anticipated DC to:  Home  Anticipated DC date:  1 to 3 days  Anticipated DC barriers: None  Consults called:  General Surgery, gastroenterology Admission status:  Observation, MedSurg  Severity of Illness: The appropriate patient status for this patient is OBSERVATION. Observation status is judged to be reasonable and  necessary in order to provide the required intensity of service to ensure the patient's safety. The patient's presenting symptoms, physical exam findings, and initial radiographic and laboratory data in the context of their medical condition is felt to place them at decreased risk for further clinical deterioration. Furthermore, it is anticipated that the patient will be medically stable for discharge from the hospital within 2 midnights of admission.    Synetta Fail MD [facility]  How to contact the [facility] Attending or Consulting provider 7A - 7P or covering provider during after hours 7P -7A, for this patient?   Check the care team in [facility] and look for a) attending/consulting [facility] provider listed and b) the [facility] team listed Log into [email or web address] and use [place]'s universal password to access. If you do not have the password, please contact the hospital operator. Locate the [facility] provider you are looking for under [facility] and page to a number that you can be directly reached. If you still have difficulty reaching the provider, please page the [facility] (Director on Call) for the Hospitalists listed on amion for assistance.  12/15/2022, 4:00 PM

## 2022-12-15 NOTE — ED Triage Notes (Signed)
Pt reports "gallbladder attack" two weeks ago, seen at ER for same. Given gen surgery for follow up but pt not feeling any better. Pt took oxycodone at 1045 today. Some nausea

## 2022-12-16 DIAGNOSIS — K8050 Calculus of bile duct without cholangitis or cholecystitis without obstruction: Secondary | ICD-10-CM | POA: Diagnosis present

## 2022-12-16 DIAGNOSIS — K831 Obstruction of bile duct: Secondary | ICD-10-CM | POA: Diagnosis not present

## 2022-12-16 LAB — CBC
HCT: 44.3 % (ref 36.0–46.0)
Hemoglobin: 13.8 g/dL (ref 12.0–15.0)
MCH: 26.4 pg (ref 26.0–34.0)
MCHC: 31.2 g/dL (ref 30.0–36.0)
MCV: 84.9 fL (ref 80.0–100.0)
Platelets: 291 10*3/uL (ref 150–400)
RBC: 5.22 MIL/uL — ABNORMAL HIGH (ref 3.87–5.11)
RDW: 15.4 % (ref 11.5–15.5)
WBC: 7.4 10*3/uL (ref 4.0–10.5)
nRBC: 0 % (ref 0.0–0.2)

## 2022-12-16 LAB — HIV ANTIBODY (ROUTINE TESTING W REFLEX): HIV Screen 4th Generation wRfx: NONREACTIVE

## 2022-12-16 LAB — COMPREHENSIVE METABOLIC PANEL
ALT: 491 U/L — ABNORMAL HIGH (ref 0–44)
AST: 196 U/L — ABNORMAL HIGH (ref 15–41)
Albumin: 4 g/dL (ref 3.5–5.0)
Alkaline Phosphatase: 290 U/L — ABNORMAL HIGH (ref 38–126)
Anion gap: 11 (ref 5–15)
BUN: 6 mg/dL (ref 6–20)
CO2: 26 mmol/L (ref 22–32)
Calcium: 9.5 mg/dL (ref 8.9–10.3)
Chloride: 102 mmol/L (ref 98–111)
Creatinine, Ser: 0.85 mg/dL (ref 0.44–1.00)
GFR, Estimated: >60 mL/min (ref >60)
Glucose, Bld: 87 mg/dL (ref 70–99)
Potassium: 4.1 mmol/L (ref 3.5–5.1)
Sodium: 139 mmol/L (ref 135–145)
Total Bilirubin: 2.8 mg/dL — ABNORMAL HIGH (ref 0.3–1.2)
Total Protein: 7.4 g/dL (ref 6.5–8.1)

## 2022-12-16 NOTE — Progress Notes (Signed)
PROGRESS NOTE  Carolyn Jefferson NFA:[id number] DOB: [birth year] DOA: 12/15/2022 PCP: [facility]   LOS: 0 days   Brief Narrative / Interim history: Carolyn Jefferson is a 32 y.o. female with no significant past medical history presenting with ongoing abdominal pain. Patient has had right upper quadrant abdominal pain starting 3 days ago.  On that day she had sudden and severe attack of pain and presented [facility].  There she had reassuring labs and CT scan showing gallstones but no cholecystitis.  Patient was discharged with pain medication. She is continue to have intermittent abdominal pain that was significant increase this morning.  Also is reporting some dark urine and possible jaundice.  Workup showed cholelithiasis and choledocholithiasis.  General surgery and gastroenterology were consulted  Subjective / 24h Interval events: Feeling well this morning, no abdominal pain, no nausea or vomiting, but she has not had anything to eat yet.  Assesement and Plan: Principal Problem:   Cholestasis Active Problems:   Cholelithiasis   Transaminitis  Principal problem Cholelithiasis, choledocholithiasis, elevated LFTs -appreciate gastroenterology as well as general surgery input.  Likely needs ERCP followed by cholecystectomy, timing to be determined by the consulting teams -For now LFTs improving  Scheduled Meds:  sodium chloride flush  3 mL Intravenous Q12H   Continuous Infusions: PRN Meds:.acetaminophen **OR** acetaminophen, oxyCODONE-acetaminophen, polyethylene glycol  Current Outpatient Medications  Medication Instructions   Famotidine-Ca Carb-Mag Hydrox (DUAL ACTION COMPLETE PO) 1 tablet, Oral, Every 4 hours PRN   oxyCODONE-acetaminophen (PERCOCET/ROXICET) 5-325 MG tablet 1 tablet, Oral, Every 6 hours PRN    Diet Orders (From admission, onward)     Start     Ordered   12/15/22 1615  Diet clear liquid Fluid consistency: Thin   Diet effective now       Question:  Fluid consistency:  Answer:  Thin   12/15/22 1614            DVT prophylaxis:    Lab Results  Component Value Date   PLT 291 12/16/2022      Code Status: Full Code  Family Communication: no family at bedside  Status is: Observation The patient will require care spanning > 2 midnights and should be moved to inpatient because: ERCP, cholecystectomy  Level of care: Med-Surg  Consultants:  GI Surgery  Objective: Vitals:   12/15/22 1241 12/15/22 1708 12/16/22 0841  BP: (!) 141/73 128/62 113/73  Pulse: 65 (!) 49 (!) 52  Resp: 16 17   Temp: 98 F (36.7 C) 97.9 F (36.6 C) 98 F (36.7 C)  TempSrc: Oral Oral Oral  SpO2: 100% 100% 100%   No intake or output data in the 24 hours ending 12/16/22 0919 Wt Readings from Last 3 Encounters:  12/13/22 63.5 kg  07/24/21 65.8 kg  04/19/20 61.7 kg    Examination:  Constitutional: NAD Eyes: no scleral icterus ENMT: Mucous membranes are moist.  Neck: normal, supple Respiratory: clear to auscultation bilaterally, no wheezing, no crackles.  Cardiovascular: Regular rate and rhythm, no murmurs / rubs / gallops. No LE edema.  Abdomen: non distended, no tenderness. Bowel sounds positive.  Musculoskeletal: no clubbing / cyanosis.   Data Reviewed: I have independently reviewed following labs and imaging studies   CBC Recent Labs  Lab 12/13/22 0440 12/15/22 1242 12/16/22 0710  WBC 10.6* 8.0 7.4  HGB 14.0 13.6 13.8  HCT 43.5 44.1 44.3  PLT 356 308 291  MCV 84.0 86.3 84.9  MCH 27.0 26.6  26.4  MCHC 32.2 30.8 31.2  RDW 15.0 15.2 15.4  LYMPHSABS 3.7  --   --   MONOABS 0.8  --   --   EOSABS 0.2  --   --   BASOSABS 0.1  --   --     Recent Labs  Lab 12/13/22 0440 12/15/22 1242 12/16/22 0710  NA 142 139 139  K 3.7 3.7 4.1  CL 101 103 102  CO2 30 26 26   GLUCOSE 112* 110* 87  BUN 16 6 6   CREATININE 0.97 0.81 0.85  CALCIUM 10.0 9.7 9.5  AST 52* 401* 196*  ALT 38 649* 491*   ALKPHOS 97 284* 290*  BILITOT 0.3 3.5* 2.8*  ALBUMIN 4.9 4.2 4.0    ------------------------------------------------------------------------------------------------------------------ No results for input(s): "CHOL", "HDL", "LDLCALC", "TRIG", "CHOLHDL", "LDLDIRECT" in the last 72 hours.  No results found for: "HGBA1C" ------------------------------------------------------------------------------------------------------------------ No results for input(s): "TSH", "T4TOTAL", "T3FREE", "THYROIDAB" in the last 72 hours.  Invalid input(s): "FREET3"  Cardiac Enzymes No results for input(s): "CKMB", "TROPONINI", "MYOGLOBIN" in the last 168 hours.  Invalid input(s): "CK" ------------------------------------------------------------------------------------------------------------------ No results found for: "BNP"  CBG: No results for input(s): "GLUCAP" in the last 168 hours.  No results found for this or any previous visit (from the past 240 hour(s)).   Radiology Studies: MR ABDOMEN MRCP W WO CONTAST  Result Date: 12/16/2022 CLINICAL DATA:  32 year old female with cholelithiasis. Right upper quadrant abdominal pain. EXAM: MRI ABDOMEN WITHOUT AND WITH CONTRAST (INCLUDING MRCP) TECHNIQUE: Multiplanar multisequence MR imaging of the abdomen was performed both before and after the administration of intravenous contrast. Heavily T2-weighted images of the biliary and pancreatic ducts were obtained, and three-dimensional MRCP images were rendered by post processing. CONTRAST:  6mL GADAVIST GADOBUTROL 1 MMOL/ML IV SOLN COMPARISON:  CT of the abdomen and pelvis 12/13/2022. Right upper quadrant abdominal ultrasound 12/15/2022. FINDINGS: Lower chest: Unremarkable. Hepatobiliary: Liver appears enlarged measuring up to 19.6 cm in craniocaudal span. Liver contour is mildly nodular, which could suggest early cirrhosis. No suspicious cystic or solid hepatic lesions. Mild diffuse increased T2 signal intensity in  the periportal aspects of the liver suggesting periportal edema. MRCP images demonstrate no intra or extrahepatic biliary ductal dilatation. Common bile duct measures up to 5 mm in the porta hepatis. However, there are tiny filling defects in the distal common bile duct measuring up to 2 mm in size, compatible with choledocholithiasis. Additionally, there are multiple tiny filling defects lying dependently in the gallbladder, compatible with cholelithiasis. Gallbladder is moderately distended. Gallbladder wall thickness is normal. No definite pericholecystic fluid or surrounding inflammatory changes. Pancreas: No pancreatic mass. No pancreatic ductal dilatation on MRCP images. No pancreatic or peripancreatic fluid collections or inflammatory changes. Spleen:  Unremarkable. Adrenals/Urinary Tract: Bilateral kidneys and adrenal glands are normal in appearance. No hydroureteronephrosis in the visualized portions of the abdomen. Stomach/Bowel: Visualized portions are unremarkable. Vascular/Lymphatic: No aneurysm identified in the visualized abdominal vasculature. No lymphadenopathy noted in the abdomen. Other: No significant volume of ascites noted in the visualized portions of the peritoneal cavity. Musculoskeletal: No aggressive appearing osseous lesions are noted in the visualized portions of the skeleton. IMPRESSION: 1. Study is positive for cholelithiasis without imaging findings to suggest acute cholecystitis at this time. 2. There is also choledocholithiasis with small stones in the common bile duct measuring up to 2 mm. No intra or extrahepatic biliary ductal dilatation to suggest acute biliary obstruction at this time. 3. Hepatomegaly with evidence of periportal edema in the liver. This is  a nonspecific finding, but correlation for signs and symptoms of acute hepatitis is recommended. 4. The liver also has a subtle nodular contour, which could suggest early changes of cirrhosis. Electronically Signed   By:  Trudie Reed M.D.   On: 12/16/2022 05:29   US Abdomen Limited RUQ (LIVER/GB)  Result Date: 12/15/2022 CLINICAL DATA:  Right upper quadrant pain EXAM: ULTRASOUND ABDOMEN LIMITED RIGHT UPPER QUADRANT COMPARISON:  CT AP from 12/13/2022 FINDINGS: Gallbladder: Scattered shadowing stones identified within the dependent portion of the gallbladder which measure up to 5 mm. No gallbladder wall thickening, gallbladder sludge or pericholecystic fluid. Positive sonographic Murphy's sign reported by sonographer. Common bile duct: Diameter: 4.3 mm Liver: No focal lesion identified. Within normal limits in parenchymal echogenicity. Portal vein is patent on color Doppler imaging with normal direction of blood flow towards the liver. Other: None. IMPRESSION: Cholelithiasis. Positive sonographic Murphy's sign reported by sonographer. No gallbladder wall thickening, gallbladder sludge or pericholecystic fluid. Electronically Signed   By: Signa Kell M.D.   On: 12/15/2022 14:16     Pamella Pert, MD, PhD [facility]  Between 7 am - 7 pm I am available, please contact me via Amion (for emergencies) or Securechat (non urgent messages)  Between 7 pm - 7 am I am not available, please contact night coverage MD/APP via Amion

## 2022-12-16 NOTE — Consult Note (Signed)
[facility] Consultation Note  Referring Provider: [facility] Primary Care Physician:  [facility] Primary Gastroenterologist:  Gentry Fitz [facility] Primary Care patient)  Reason for Consultation:  elevated LFTs  HPI: Rafia Razzak is a 32 y.o. female 2 months post partum.  Couple weeks of epigastric and right upper quadrant pain, usually wakes her up from sleep in early morning.  No prior symptoms before 2 weeks ago.  Some jaundice noted.  No blood in stool or change in bowel habits.  Imaging cholelithiasis, choledocholithiasis and mild nodularity to liver.   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 Authorizing Provider  Famotidine-Ca Carb-Mag Hydrox (DUAL ACTION COMPLETE PO) Take 1 tablet by mouth every 4 (four) hours as needed (pain).   Yes [provider]  oxyCODONE-acetaminophen (PERCOCET/ROXICET) 5-325 MG tablet Take 1 tablet by mouth every 6 (six) hours as needed for severe pain. 12/13/22  Yes Gwyneth Sprout, MD    Current Facility-Administered Medications  Medication Dose Route Frequency Provider Last Rate Last Admin   acetaminophen (TYLENOL) tablet 650 mg  650 mg Oral Q6H PRN Synetta Fail, MD       Or   acetaminophen (TYLENOL) suppository 650 mg  650 mg Rectal Q6H PRN Synetta Fail, MD       oxyCODONE-acetaminophen (PERCOCET/ROXICET) 5-325 MG per tablet 1 tablet  1 tablet Oral Q6H PRN Synetta Fail, MD       polyethylene glycol (MIRALAX / GLYCOLAX)  packet 17 g  17 g Oral Daily PRN Synetta Fail, MD       sodium chloride flush (NS) 0.9 % injection 3 mL  3 mL Intravenous Q12H Synetta Fail, MD   3 mL at 12/16/22 1017    Allergies as of 12/15/2022   (No Known Allergies)    Family History  Problem Relation Age of Onset   Ulcerative colitis Paternal Grandfather    Stroke Paternal Grandfather    Cancer Other        colon ca   Cancer Other        colon ca   Diabetes Maternal Grandfather     Social History   Socioeconomic History   Marital status: Married    Spouse name: Not on file   Number of children: Not on file   Years of education: Not on file   Highest education level: Not on file  Occupational History   Not on file  Tobacco Use   Smoking status: Never   Smokeless tobacco: Never  Vaping Use   Vaping status: Never Used  Substance and Sexual Activity   Alcohol use: Not Currently    Comment: 1 drink/month   Drug use: Never   Sexual activity: Yes    Partners: Male    Birth control/protection: None    Comment: Rhythm  Other Topics Concern   Not on file  Social History Narrative   Not on file   Social Determinants  of Health   Financial Resource Strain: Not on file  Food Insecurity: No Food Insecurity (12/15/2022)   Hunger Vital Sign    Worried About Running Out of Food in the Last Year: Never true    Ran Out of Food in the Last Year: Never true  Transportation Needs: No Transportation Needs (12/15/2022)   PRAPARE - Administrator, Civil Service (Medical): No    Lack of Transportation (Non-Medical): No  Physical Activity: Not on file  Stress: Not on file  Social Connections: Unknown (12/13/2022)   Received from [facility]   Social Network    Social Network: Not on file  Intimate Partner Violence: Not At Risk (12/15/2022)   Humiliation, Afraid, Rape, and Kick questionnaire    Fear of Current or Ex-Partner: No    Emotionally Abused: No    Physically Abused: No    Sexually Abused:  No    Review of Systems: As per HPI, all others negative  Physical Exam: Vital signs in last 24 hours: Temp:  [97.9 F (36.6 C)-98 F (36.7 C)] 98 F (36.7 C) (08/19 0841) Pulse Rate:  [49-65] 52 (08/19 0841) Resp:  [16-17] 17 (08/18 1708) BP: (113-141)/(62-73) 113/73 (08/19 0841) SpO2:  [100 %] 100 % (08/19 0841)   General:   Alert,  Well-developed, well-nourished, pleasant and cooperative in NAD Head:  Normocephalic and atraumatic. Eyes:  Sclera clear, no icterus.   Conjunctiva pink. Ears:  Normal auditory acuity. Nose:  No deformity, discharge,  or lesions. Mouth:  No deformity or lesions.  Oropharynx pink & moist. Neck:  Supple; no masses or thyromegaly. Lungs:  No respiratory distress Abdomen:  Soft, nontender and nondistended. No masses, hepatosplenomegaly or hernias noted. Normal bowel sounds, without guarding, and without rebound.     Msk:  Symmetrical without gross deformities. Normal posture. Pulses:  Normal pulses noted. Extremities:  Without clubbing or edema. Neurologic:  Alert and  oriented x4;  grossly normal neurologically. Skin:  Intact without significant lesions or rashes. Psych:  Alert and cooperative. Normal mood and affect.   Lab Results: Recent Labs    12/15/22 1242 12/16/22 0710  WBC 8.0 7.4  HGB 13.6 13.8  HCT 44.1 44.3  PLT 308 291   BMET Recent Labs    12/15/22 1242 12/16/22 0710  NA 139 139  K 3.7 4.1  CL 103 102  CO2 26 26  GLUCOSE 110* 87  BUN 6 6  CREATININE 0.81 0.85  CALCIUM 9.7 9.5   LFT Recent Labs    12/16/22 0710  PROT 7.4  ALBUMIN 4.0  AST 196*  ALT 491*  ALKPHOS 290*  BILITOT 2.8*   PT/INR No results for input(s): "LABPROT", "INR" in the last 72 hours.  Studies/Results: MR ABDOMEN MRCP W WO CONTAST  Result Date: 12/16/2022 CLINICAL DATA:  32 year old female with cholelithiasis. Right upper quadrant abdominal pain. EXAM: MRI ABDOMEN WITHOUT AND WITH CONTRAST (INCLUDING MRCP) TECHNIQUE: Multiplanar  multisequence MR imaging of the abdomen was performed both before and after the administration of intravenous contrast. Heavily T2-weighted images of the biliary and pancreatic ducts were obtained, and three-dimensional MRCP images were rendered by post processing. CONTRAST:  6mL GADAVIST GADOBUTROL 1 MMOL/ML IV SOLN COMPARISON:  CT of the abdomen and pelvis 12/13/2022. Right upper quadrant abdominal ultrasound 12/15/2022. FINDINGS: Lower chest: Unremarkable. Hepatobiliary: Liver appears enlarged measuring up to 19.6 cm in craniocaudal span. Liver contour is mildly nodular, which could suggest early cirrhosis. No suspicious cystic or solid hepatic lesions. Mild  diffuse increased T2 signal intensity in the periportal aspects of the liver suggesting periportal edema. MRCP images demonstrate no intra or extrahepatic biliary ductal dilatation. Common bile duct measures up to 5 mm in the porta hepatis. However, there are tiny filling defects in the distal common bile duct measuring up to 2 mm in size, compatible with choledocholithiasis. Additionally, there are multiple tiny filling defects lying dependently in the gallbladder, compatible with cholelithiasis. Gallbladder is moderately distended. Gallbladder wall thickness is normal. No definite pericholecystic fluid or surrounding inflammatory changes. Pancreas: No pancreatic mass. No pancreatic ductal dilatation on MRCP images. No pancreatic or peripancreatic fluid collections or inflammatory changes. Spleen:  Unremarkable. Adrenals/Urinary Tract: Bilateral kidneys and adrenal glands are normal in appearance. No hydroureteronephrosis in the visualized portions of the abdomen. Stomach/Bowel: Visualized portions are unremarkable. Vascular/Lymphatic: No aneurysm identified in the visualized abdominal vasculature. No lymphadenopathy noted in the abdomen. Other: No significant volume of ascites noted in the visualized portions of the peritoneal cavity. Musculoskeletal: No  aggressive appearing osseous lesions are noted in the visualized portions of the skeleton. IMPRESSION: 1. Study is positive for cholelithiasis without imaging findings to suggest acute cholecystitis at this time. 2. There is also choledocholithiasis with small stones in the common bile duct measuring up to 2 mm. No intra or extrahepatic biliary ductal dilatation to suggest acute biliary obstruction at this time. 3. Hepatomegaly with evidence of periportal edema in the liver. This is a nonspecific finding, but correlation for signs and symptoms of acute hepatitis is recommended. 4. The liver also has a subtle nodular contour, which could suggest early changes of cirrhosis. Electronically Signed   By: Trudie Reed M.D.   On: 12/16/2022 05:29   US Abdomen Limited RUQ (LIVER/GB)  Result Date: 12/15/2022 CLINICAL DATA:  Right upper quadrant pain EXAM: ULTRASOUND ABDOMEN LIMITED RIGHT UPPER QUADRANT COMPARISON:  CT AP from 12/13/2022 FINDINGS: Gallbladder: Scattered shadowing stones identified within the dependent portion of the gallbladder which measure up to 5 mm. No gallbladder wall thickening, gallbladder sludge or pericholecystic fluid. Positive sonographic Murphy's sign reported by sonographer. Common bile duct: Diameter: 4.3 mm Liver: No focal lesion identified. Within normal limits in parenchymal echogenicity. Portal vein is patent on color Doppler imaging with normal direction of blood flow towards the liver. Other: None. IMPRESSION: Cholelithiasis. Positive sonographic Murphy's sign reported by sonographer. No gallbladder wall thickening, gallbladder sludge or pericholecystic fluid. Electronically Signed   By: Signa Kell M.D.   On: 12/15/2022 14:16    Impression:   Abdominal pain, RUQ, recurrent.  2 months post-partum. Gallstones and CBD stones. Elevated LFTs. MRI imaging suspicious for cirrhosis.  She has no other readily identifiable risk factors for cirrhosis at this time.  No  thrombocytopenia or obvious portal hypertension on imaging.  Plan:   I recommend ERCP and surgical consult for consideration of cholecystectomy.  Surgeons hopefully could do intraoperative liver biopsy to better assess for presence (or absence) of cirrhosis. Timing of ERCP not clear, working with ERCP team to ascertain timing, but will not be today thus ok for clear liquid diet. Risks (up to and including bleeding, infection, perforation, pancreatitis that can be complicated by infected necrosis and death), benefits (removal of stones, alleviating blockage, decreasing risk of cholangitis or choledocholithiasis-related pancreatitis), and alternatives (watchful waiting, percutaneous transhepatic cholangiography) of ERCP were explained to patient/family in detail and patient elects to proceed.  [facility] will follow.   LOS: 0 days   Amiaya Mcneeley Judie Petit  12/16/2022, 11:27 AM  Cell [phone]  If no answer or after 5 PM call [phone]

## 2022-12-17 DIAGNOSIS — K831 Obstruction of bile duct: Secondary | ICD-10-CM | POA: Diagnosis not present

## 2022-12-17 LAB — COMPREHENSIVE METABOLIC PANEL
ALT: 372 U/L — ABNORMAL HIGH (ref 0–44)
ALT: 390 U/L — ABNORMAL HIGH (ref 0–44)
AST: 141 U/L — ABNORMAL HIGH (ref 15–41)
AST: 143 U/L — ABNORMAL HIGH (ref 15–41)
Albumin: 4.1 g/dL (ref 3.5–5.0)
Albumin: 4.2 g/dL (ref 3.5–5.0)
Alkaline Phosphatase: 287 U/L — ABNORMAL HIGH (ref 38–126)
Alkaline Phosphatase: 288 U/L — ABNORMAL HIGH (ref 38–126)
Anion gap: 11 (ref 5–15)
Anion gap: 12 (ref 5–15)
BUN: 7 mg/dL (ref 6–20)
BUN: 8 mg/dL (ref 6–20)
CO2: 24 mmol/L (ref 22–32)
CO2: 26 mmol/L (ref 22–32)
Calcium: 9.4 mg/dL (ref 8.9–10.3)
Calcium: 9.8 mg/dL (ref 8.9–10.3)
Chloride: 100 mmol/L (ref 98–111)
Chloride: 102 mmol/L (ref 98–111)
Creatinine, Ser: 0.88 mg/dL (ref 0.44–1.00)
Creatinine, Ser: 0.89 mg/dL (ref 0.44–1.00)
GFR, Estimated: >60 mL/min (ref >60)
GFR, Estimated: >60 mL/min (ref >60)
Glucose, Bld: 78 mg/dL (ref 70–99)
Glucose, Bld: 91 mg/dL (ref 70–99)
Potassium: 3.9 mmol/L (ref 3.5–5.1)
Potassium: 3.9 mmol/L (ref 3.5–5.1)
Sodium: 137 mmol/L (ref 135–145)
Sodium: 138 mmol/L (ref 135–145)
Total Bilirubin: 2.9 mg/dL — ABNORMAL HIGH (ref 0.3–1.2)
Total Bilirubin: 3 mg/dL — ABNORMAL HIGH (ref 0.3–1.2)
Total Protein: 7.6 g/dL (ref 6.5–8.1)
Total Protein: 7.8 g/dL (ref 6.5–8.1)

## 2022-12-17 LAB — CBC
HCT: 44.7 % (ref 36.0–46.0)
HCT: 45.9 % (ref 36.0–46.0)
Hemoglobin: 14 g/dL (ref 12.0–15.0)
Hemoglobin: 14.5 g/dL (ref 12.0–15.0)
MCH: 26 pg (ref 26.0–34.0)
MCH: 26.5 pg (ref 26.0–34.0)
MCHC: 31.3 g/dL (ref 30.0–36.0)
MCHC: 31.6 g/dL (ref 30.0–36.0)
MCV: 82.4 fL (ref 80.0–100.0)
MCV: 84.5 fL (ref 80.0–100.0)
Platelets: 315 10*3/uL (ref 150–400)
Platelets: 330 10*3/uL (ref 150–400)
RBC: 5.29 MIL/uL — ABNORMAL HIGH (ref 3.87–5.11)
RBC: 5.57 MIL/uL — ABNORMAL HIGH (ref 3.87–5.11)
RDW: 15.6 % — ABNORMAL HIGH (ref 11.5–15.5)
RDW: 15.6 % — ABNORMAL HIGH (ref 11.5–15.5)
WBC: 7.6 10*3/uL (ref 4.0–10.5)
WBC: 7.7 10*3/uL (ref 4.0–10.5)
nRBC: 0 % (ref 0.0–0.2)
nRBC: 0 % (ref 0.0–0.2)

## 2022-12-17 LAB — MAGNESIUM: Magnesium: 2.1 mg/dL (ref 1.7–2.4)

## 2022-12-17 NOTE — Progress Notes (Signed)
PROGRESS NOTE  Carolyn Jefferson:[id number] DOB: October 28, 1990 DOA: 12/15/2022 PCP: [facility]   LOS: 1 day   Brief Narrative / Interim history: Carolyn Jefferson is a 32 y.o. female with no significant past medical history presenting with ongoing abdominal pain. Patient has had right upper quadrant abdominal pain starting 3 days ago.  On that day she had sudden and severe attack of pain and [facility].  There she had reassuring labs and CT scan showing gallstones but no cholecystitis.  Patient was discharged with pain medication. She is continue to have intermittent abdominal pain that was significant increase this morning.  Also is reporting some dark urine and possible jaundice.  Workup showed cholelithiasis and choledocholithiasis.  General surgery and gastroenterology were consulted  Subjective / 24h Interval events: Continues to feel well and awaiting scheduling of her ERCP  Assesement and Plan: Principal Problem:   Cholestasis Active Problems:   Cholelithiasis   Transaminitis   Choledocholithiasis  Principal problem Cholelithiasis, choledocholithiasis, elevated LFTs -appreciate gastroenterology as well as general surgery input.  Likely needs ERCP followed by cholecystectomy, timing to be determined by the consulting teams, looks like ERCP could not be done today, hopefully tomorrow -LFTs are stable/improving for now.  Scheduled Meds:  sodium chloride flush  3 mL Intravenous Q12H   Continuous Infusions: PRN Meds:.acetaminophen **OR** acetaminophen, oxyCODONE-acetaminophen, polyethylene glycol  Current Outpatient Medications  Medication Instructions   Famotidine-Ca Carb-Mag Hydrox (DUAL ACTION COMPLETE PO) 1 tablet, Oral, Every 4 hours PRN   oxyCODONE-acetaminophen (PERCOCET/ROXICET) 5-325 MG tablet 1 tablet, Oral, Every 6 hours PRN    Diet Orders (From admission, onward)     Start     Ordered   12/18/22 0001   Diet NPO time specified  Diet effective midnight        12/17/22 1014   12/17/22 1015  Diet clear liquid Room service appropriate? Yes; Fluid consistency: Thin  Diet effective now       Question Answer Comment  Room service appropriate? Yes   Fluid consistency: Thin      12/17/22 1014            DVT prophylaxis:    Lab Results  Component Value Date   PLT 315 12/16/2022      Code Status: Full Code  Family Communication: no family at bedside  Status is: Observation The patient will require care spanning > 2 midnights and should be moved to inpatient because: ERCP, cholecystectomy  Level of care: Med-Surg  Consultants:  GI Surgery  Objective: Vitals:   12/16/22 1606 12/16/22 2015 12/17/22 0505 12/17/22 0752  BP: 98/65 111/77 (!) 113/59 113/79  Pulse: (!) 55 61 (!) 51 62  Resp: 16 16  15   Temp: 97.9 F (36.6 C) 97.8 F (36.6 C) 98.1 F (36.7 C) 98 F (36.7 C)  TempSrc: Oral Oral Oral Oral  SpO2: 99% 100% 99% 99%    Intake/Output Summary (Last 24 hours) at 12/17/2022 1015 Last data filed at 12/16/2022 2141 Gross per 24 hour  Intake 243 ml  Output --  Net 243 ml   Wt Readings from Last 3 Encounters:  12/13/22 63.5 kg  07/24/21 65.8 kg  04/19/20 61.7 kg    Examination:  Constitutional: NAD Respiratory: CTA Cardiovascular: RRR  Data Reviewed: I have independently reviewed following labs and imaging studies   CBC Recent Labs  Lab 12/13/22 0440 12/15/22 1242 12/16/22 0710 12/16/22 2343  WBC 10.6* 8.0 7.4 7.6  HGB 14.0 13.6 13.8 14.0  HCT 43.5 44.1 44.3 44.7  PLT 356 308 291 315  MCV 84.0 86.3 84.9 84.5  MCH 27.0 26.6 26.4 26.5  MCHC 32.2 30.8 31.2 31.3  RDW 15.0 15.2 15.4 15.6*  LYMPHSABS 3.7  --   --   --   MONOABS 0.8  --   --   --   EOSABS 0.2  --   --   --   BASOSABS 0.1  --   --   --     Recent Labs  Lab 12/13/22 0440 12/15/22 1242 12/16/22 0710 12/16/22 2343  NA 142 139 139 137  K 3.7 3.7 4.1 3.9  CL 101 103 102 100  CO2  30 26 26 26   GLUCOSE 112* 110* 87 78  BUN 16 6 6 7   CREATININE 0.97 0.81 0.85 0.88  CALCIUM 10.0 9.7 9.5 9.4  AST 52* 401* 196* 143*  ALT 38 649* 491* 390*  ALKPHOS 97 284* 290* 288*  BILITOT 0.3 3.5* 2.8* 3.0*  ALBUMIN 4.9 4.2 4.0 4.1  MG  --   --   --  2.1    ------------------------------------------------------------------------------------------------------------------ No results for input(s): "CHOL", "HDL", "LDLCALC", "TRIG", "CHOLHDL", "LDLDIRECT" in the last 72 hours.  No results found for: "HGBA1C" ------------------------------------------------------------------------------------------------------------------ No results for input(s): "TSH", "T4TOTAL", "T3FREE", "THYROIDAB" in the last 72 hours.  Invalid input(s): "FREET3"  Cardiac Enzymes No results for input(s): "CKMB", "TROPONINI", "MYOGLOBIN" in the last 168 hours.  Invalid input(s): "CK" ------------------------------------------------------------------------------------------------------------------ No results found for: "BNP"  CBG: No results for input(s): "GLUCAP" in the last 168 hours.  No results found for this or any previous visit (from the past 240 hour(s)).   Radiology Studies: No results found.   Pamella Pert, MD, PhD [facility]  Between 7 am - 7 pm I am available, please contact me via Amion (for emergencies) or Securechat (non urgent messages)  Between 7 pm - 7 am I am not available, please contact night coverage MD/APP via Amion

## 2022-12-17 NOTE — Progress Notes (Signed)
Patient ID: Carolyn Jefferson, female   DOB: 01-18-1991, 32 y.o.   MRN: [id number] [facility] Surgery Progress Note     Subjective: CC-  Feeling better since admission. Abdominal pain is less. She has been NPO. Awaiting plan from GI regarding ERCP.  Objective: Vital signs in last 24 hours: Temp:  [97.8 F (36.6 C)-98.1 F (36.7 C)] 98 F (36.7 C) (08/20 0752) Pulse Rate:  [51-62] 62 (08/20 0752) Resp:  [15-16] 15 (08/20 0752) BP: (98-113)/(59-79) 113/79 (08/20 0752) SpO2:  [99 %-100 %] 99 % (08/20 0752) Last BM Date : 12/14/22  Intake/Output from previous day: 08/19 0701 - 08/20 0700 In: 243 [P.O.:240; I.V.:3] Out: -  Intake/Output this shift: No intake/output data recorded.  PE: Gen:  Alert, NAD, pleasant Abd: soft, ND, NT  Lab Results:  Recent Labs    12/16/22 0710 12/16/22 2343  WBC 7.4 7.6  HGB 13.8 14.0  HCT 44.3 44.7  PLT 291 315   BMET Recent Labs    12/16/22 0710 12/16/22 2343  NA 139 137  K 4.1 3.9  CL 102 100  CO2 26 26  GLUCOSE 87 78  BUN 6 7  CREATININE 0.85 0.88  CALCIUM 9.5 9.4   PT/INR No results for input(s): "LABPROT", "INR" in the last 72 hours. CMP     Component Value Date/Time   NA 137 12/16/2022 2343   NA 141 02/01/2019 0940   K 3.9 12/16/2022 2343   CL 100 12/16/2022 2343   CO2 26 12/16/2022 2343   GLUCOSE 78 12/16/2022 2343   BUN 7 12/16/2022 2343   BUN 11 02/01/2019 0940   CREATININE 0.88 12/16/2022 2343   CREATININE 0.68 07/24/2021 1550   CALCIUM 9.4 12/16/2022 2343   PROT 7.6 12/16/2022 2343   PROT 7.3 02/01/2019 0940   ALBUMIN 4.1 12/16/2022 2343   ALBUMIN 4.9 02/01/2019 0940   AST 143 (H) 12/16/2022 2343   ALT 390 (H) 12/16/2022 2343   ALKPHOS 288 (H) 12/16/2022 2343   BILITOT 3.0 (H) 12/16/2022 2343   BILITOT 0.5 02/01/2019 0940   GFRNONAA >60 12/16/2022 2343   GFRAA 103 02/01/2019 0940   Lipase     Component Value Date/Time   LIPASE 38 12/15/2022 1242       Studies/Results: MR ABDOMEN MRCP  W WO CONTAST  Result Date: 12/16/2022 CLINICAL DATA:  32 year old female with cholelithiasis. Right upper quadrant abdominal pain. EXAM: MRI ABDOMEN WITHOUT AND WITH CONTRAST (INCLUDING MRCP) TECHNIQUE: Multiplanar multisequence MR imaging of the abdomen was performed both before and after the administration of intravenous contrast. Heavily T2-weighted images of the biliary and pancreatic ducts were obtained, and three-dimensional MRCP images were rendered by post processing. CONTRAST:  6mL GADAVIST GADOBUTROL 1 MMOL/ML IV SOLN COMPARISON:  CT of the abdomen and pelvis 12/13/2022. Right upper quadrant abdominal ultrasound 12/15/2022. FINDINGS: Lower chest: Unremarkable. Hepatobiliary: Liver appears enlarged measuring up to 19.6 cm in craniocaudal span. Liver contour is mildly nodular, which could suggest early cirrhosis. No suspicious cystic or solid hepatic lesions. Mild diffuse increased T2 signal intensity in the periportal aspects of the liver suggesting periportal edema. MRCP images demonstrate no intra or extrahepatic biliary ductal dilatation. Common bile duct measures up to 5 mm in the porta hepatis. However, there are tiny filling defects in the distal common bile duct measuring up to 2 mm in size, compatible with choledocholithiasis. Additionally, there are multiple tiny filling defects lying dependently in the gallbladder, compatible with cholelithiasis. Gallbladder is moderately distended. Gallbladder wall thickness is  normal. No definite pericholecystic fluid or surrounding inflammatory changes. Pancreas: No pancreatic mass. No pancreatic ductal dilatation on MRCP images. No pancreatic or peripancreatic fluid collections or inflammatory changes. Spleen:  Unremarkable. Adrenals/Urinary Tract: Bilateral kidneys and adrenal glands are normal in appearance. No hydroureteronephrosis in the visualized portions of the abdomen. Stomach/Bowel: Visualized portions are unremarkable. Vascular/Lymphatic: No  aneurysm identified in the visualized abdominal vasculature. No lymphadenopathy noted in the abdomen. Other: No significant volume of ascites noted in the visualized portions of the peritoneal cavity. Musculoskeletal: No aggressive appearing osseous lesions are noted in the visualized portions of the skeleton. IMPRESSION: 1. Study is positive for cholelithiasis without imaging findings to suggest acute cholecystitis at this time. 2. There is also choledocholithiasis with small stones in the common bile duct measuring up to 2 mm. No intra or extrahepatic biliary ductal dilatation to suggest acute biliary obstruction at this time. 3. Hepatomegaly with evidence of periportal edema in the liver. This is a nonspecific finding, but correlation for signs and symptoms of acute hepatitis is recommended. 4. The liver also has a subtle nodular contour, which could suggest early changes of cirrhosis. Electronically Signed   By: Trudie Reed M.D.   On: 12/16/2022 05:29   US Abdomen Limited RUQ (LIVER/GB)  Result Date: 12/15/2022 CLINICAL DATA:  Right upper quadrant pain EXAM: ULTRASOUND ABDOMEN LIMITED RIGHT UPPER QUADRANT COMPARISON:  CT AP from 12/13/2022 FINDINGS: Gallbladder: Scattered shadowing stones identified within the dependent portion of the gallbladder which measure up to 5 mm. No gallbladder wall thickening, gallbladder sludge or pericholecystic fluid. Positive sonographic Murphy's sign reported by sonographer. Common bile duct: Diameter: 4.3 mm Liver: No focal lesion identified. Within normal limits in parenchymal echogenicity. Portal vein is patent on color Doppler imaging with normal direction of blood flow towards the liver. Other: None. IMPRESSION: Cholelithiasis. Positive sonographic Murphy's sign reported by sonographer. No gallbladder wall thickening, gallbladder sludge or pericholecystic fluid. Electronically Signed   By: Signa Kell M.D.   On: 12/15/2022 14:16     Anti-infectives: Anti-infectives (From admission, onward)    None        Assessment/Plan Choledocholithiasis - MRCP with choledocholithiasis with small stones in the common bile duct measuring up to 2 mm, no intra or extrahepatic biliary ductal dilatation to suggest acute biliary obstruction at this time; no acute cholecystitis - labs pending this morning - Await GI plan regarding ERCP. She is NPO. Ultimately plan for lap chole with liver biopsy once the common bile duct is investigated and or cleared.  ID - none FEN - IVF, NPO VTE - SCDs Foley - none   I reviewed Consultant gastroenterology notes, hospitalist notes, last 24 h vitals and pain scores, last 48 h intake and output, and last 24 h labs and trends.    LOS: 1 day    Franne Forts, [facility] Surgery 12/17/2022, 9:19 AM Please see Amion for pager number during day hours 7:00am-4:30pm

## 2022-12-17 NOTE — Progress Notes (Signed)
   12/17/22 1247  TOC Brief Assessment  Insurance and Status Reviewed  Patient has primary care physician Yes  Home environment has been reviewed yes  Prior level of function: independent  Prior/Current Home Services No current home services  Social Determinants of Health Reivew SDOH reviewed no interventions necessary  Readmission risk has been reviewed Yes  Transition of care needs no transition of care needs at this time

## 2022-12-17 NOTE — Progress Notes (Signed)
No anesthesia availability for ERCP today.  Clear liquids today, NPO after midnight.  Plan for ERCP with Dr. Ewing Schlein tomorrow 11:30 am.

## 2022-12-18 ENCOUNTER — Encounter (HOSPITAL_COMMUNITY)
Admission: EM | Disposition: A | Discharge status: Home / Self Care | Payer: Self-pay | Source: Home / Self Care | Attending: Emergency Medicine

## 2022-12-18 ENCOUNTER — Observation Stay (HOSPITAL_COMMUNITY): Payer: 59

## 2022-12-18 ENCOUNTER — Inpatient Hospital Stay (HOSPITAL_BASED_OUTPATIENT_CLINIC_OR_DEPARTMENT_OTHER): Payer: 59 | Admitting: Certified Registered Nurse Anesthetist

## 2022-12-18 ENCOUNTER — Inpatient Hospital Stay (HOSPITAL_COMMUNITY): Payer: 59 | Admitting: Certified Registered Nurse Anesthetist

## 2022-12-18 ENCOUNTER — Encounter (HOSPITAL_COMMUNITY): Payer: Self-pay | Admitting: Internal Medicine

## 2022-12-18 DIAGNOSIS — K8050 Calculus of bile duct without cholangitis or cholecystitis without obstruction: Secondary | ICD-10-CM

## 2022-12-18 HISTORY — PX: PANCREATIC STENT PLACEMENT: SHX5539

## 2022-12-18 HISTORY — PX: SPHINCTEROTOMY: SHX5544

## 2022-12-18 HISTORY — PX: ERCP: SHX5425

## 2022-12-18 LAB — COMPREHENSIVE METABOLIC PANEL
ALT: 299 U/L — ABNORMAL HIGH (ref 0–44)
AST: 86 U/L — ABNORMAL HIGH (ref 15–41)
Albumin: 4 g/dL (ref 3.5–5.0)
Alkaline Phosphatase: 262 U/L — ABNORMAL HIGH (ref 38–126)
Anion gap: 10 (ref 5–15)
BUN: 6 mg/dL (ref 6–20)
CO2: 25 mmol/L (ref 22–32)
Calcium: 9.4 mg/dL (ref 8.9–10.3)
Chloride: 100 mmol/L (ref 98–111)
Creatinine, Ser: 0.78 mg/dL (ref 0.44–1.00)
GFR, Estimated: >60 mL/min (ref >60)
Glucose, Bld: 78 mg/dL (ref 70–99)
Potassium: 3.5 mmol/L (ref 3.5–5.1)
Sodium: 135 mmol/L (ref 135–145)
Total Bilirubin: 1.6 mg/dL — ABNORMAL HIGH (ref 0.3–1.2)
Total Protein: 7.2 g/dL (ref 6.5–8.1)

## 2022-12-18 SURGERY — ERCP, WITH INTERVENTION IF INDICATED
Anesthesia: General

## 2022-12-18 MED ORDER — SUGAMMADEX SODIUM 200 MG/2ML IV SOLN
INTRAVENOUS | Status: DC | PRN
  Administered 2022-12-18: 200 mg via INTRAVENOUS

## 2022-12-18 MED ORDER — DEXAMETHASONE SODIUM PHOSPHATE 10 MG/ML IJ SOLN
INTRAMUSCULAR | Status: DC | PRN
  Administered 2022-12-18: 10 mg via INTRAVENOUS

## 2022-12-18 MED ORDER — CHLORHEXIDINE GLUCONATE CLOTH 2 % EX PADS
6.0000 | MEDICATED_PAD | Freq: Once | CUTANEOUS | Status: AC
  Administered 2022-12-18: 6 via TOPICAL

## 2022-12-18 MED ORDER — DICLOFENAC SUPPOSITORY 100 MG
RECTAL | Status: DC | PRN
  Administered 2022-12-18: 100 mg via RECTAL

## 2022-12-18 MED ORDER — FENTANYL CITRATE (PF) 100 MCG/2ML IJ SOLN
INTRAMUSCULAR | Status: DC | PRN
Start: 2022-12-18 — End: 2022-12-18
  Administered 2022-12-18: 100 ug via INTRAVENOUS

## 2022-12-18 MED ORDER — LACTATED RINGERS IV SOLN: INTRAVENOUS | Status: DC

## 2022-12-18 MED ORDER — SODIUM CHLORIDE 0.9 % IV SOLN: INTRAVENOUS | Status: DC

## 2022-12-18 MED ORDER — FENTANYL CITRATE (PF) 100 MCG/2ML IJ SOLN
INTRAMUSCULAR | Status: AC
  Filled 2022-12-18: qty 2

## 2022-12-18 MED ORDER — DICLOFENAC SUPPOSITORY 100 MG
RECTAL | Status: AC
  Filled 2022-12-18: qty 1

## 2022-12-18 MED ORDER — ONDANSETRON HCL 4 MG/2ML IJ SOLN
INTRAMUSCULAR | Status: DC | PRN
  Administered 2022-12-18: 4 mg via INTRAVENOUS

## 2022-12-18 MED ORDER — CHLORHEXIDINE GLUCONATE CLOTH 2 % EX PADS
6.0000 | MEDICATED_PAD | Freq: Once | CUTANEOUS | Status: AC
  Administered 2022-12-19: 6 via TOPICAL

## 2022-12-18 MED ORDER — INDOCYANINE GREEN 25 MG IV SOLR
7.5000 mg | Freq: Once | INTRAVENOUS | Status: DC
  Filled 2022-12-18: qty 10

## 2022-12-18 MED ORDER — SUCCINYLCHOLINE CHLORIDE 200 MG/10ML IV SOSY
PREFILLED_SYRINGE | INTRAVENOUS | Status: DC | PRN
  Administered 2022-12-18: 120 mg via INTRAVENOUS

## 2022-12-18 MED ORDER — PROPOFOL 10 MG/ML IV BOLUS
INTRAVENOUS | Status: DC | PRN
  Administered 2022-12-18: 150 mg via INTRAVENOUS

## 2022-12-18 MED ORDER — CIPROFLOXACIN IN D5W 400 MG/200ML IV SOLN
400.0000 mg | Freq: Once | INTRAVENOUS | Status: AC
  Administered 2022-12-18: 400 mg via INTRAVENOUS

## 2022-12-18 MED ORDER — SODIUM CHLORIDE 0.9 % IV SOLN
INTRAVENOUS | Status: DC | PRN
  Administered 2022-12-18: 30 mL

## 2022-12-18 MED ORDER — MIDAZOLAM HCL 2 MG/2ML IJ SOLN
INTRAMUSCULAR | Status: AC
  Filled 2022-12-18: qty 2

## 2022-12-18 MED ORDER — DICLOFENAC SUPPOSITORY 100 MG: 100.0000 mg | Freq: Once | RECTAL | Status: DC

## 2022-12-18 MED ORDER — SODIUM CHLORIDE 0.9 % IV SOLN
2.0000 g | INTRAVENOUS | Status: AC
  Administered 2022-12-19: 2 g via INTRAVENOUS
  Filled 2022-12-18: qty 20

## 2022-12-18 MED ORDER — CIPROFLOXACIN IN D5W 400 MG/200ML IV SOLN
INTRAVENOUS | Status: AC
  Filled 2022-12-18: qty 200

## 2022-12-18 MED ORDER — MIDAZOLAM HCL 2 MG/2ML IJ SOLN
INTRAMUSCULAR | Status: DC | PRN
  Administered 2022-12-18: 2 mg via INTRAVENOUS

## 2022-12-18 MED ORDER — ROCURONIUM BROMIDE 10 MG/ML (PF) SYRINGE
PREFILLED_SYRINGE | INTRAVENOUS | Status: DC | PRN
  Administered 2022-12-18: 40 mg via INTRAVENOUS

## 2022-12-18 MED ORDER — GLUCAGON HCL RDNA (DIAGNOSTIC) 1 MG IJ SOLR
INTRAMUSCULAR | Status: AC
  Filled 2022-12-18: qty 1

## 2022-12-18 MED ORDER — LIDOCAINE 2% (20 MG/ML) 5 ML SYRINGE
INTRAMUSCULAR | Status: DC | PRN
  Administered 2022-12-18: 80 mg via INTRAVENOUS

## 2022-12-18 NOTE — Anesthesia Procedure Notes (Signed)
Procedure Name: Intubation Date/Time: 12/18/2022 11:52 AM  Performed by: Alease Medina, CRNAPre-anesthesia Checklist: Patient identified, Emergency Drugs available, Suction available and Patient being monitored Patient Re-evaluated:Patient Re-evaluated prior to induction Oxygen Delivery Method: Circle system utilized Preoxygenation: Pre-oxygenation with 100% oxygen Induction Type: IV induction Ventilation: Mask ventilation without difficulty Laryngoscope Size: Mac and 3 Grade View: Grade I Tube type: Oral Tube size: 7.0 mm Number of attempts: 1 Airway Equipment and Method: Stylet and Oral airway Placement Confirmation: ETT inserted through vocal cords under direct vision, positive ETCO2 and breath sounds checked- equal and bilateral Secured at: 22 cm Tube secured with: Tape Dental Injury: Teeth and Oropharynx as per pre-operative assessment

## 2022-12-18 NOTE — Anesthesia Preprocedure Evaluation (Signed)
Anesthesia Evaluation  Patient identified by MRN, date of birth, ID band Patient awake    Reviewed: Allergy & Precautions, H&P , NPO status , Patient's Chart, lab work & pertinent test results  Airway Mallampati: II  TM Distance: >3 FB Neck ROM: Full    Dental no notable dental hx.    Pulmonary neg pulmonary ROS   Pulmonary exam normal breath sounds clear to auscultation       Cardiovascular negative cardio ROS Normal cardiovascular exam Rhythm:Regular Rate:Normal     Neuro/Psych negative neurological ROS  negative psych ROS   GI/Hepatic negative GI ROS, Neg liver ROS,,,  Endo/Other  negative endocrine ROS    Renal/GU negative Renal ROS  negative genitourinary   Musculoskeletal negative musculoskeletal ROS (+)    Abdominal   Peds negative pediatric ROS (+)  Hematology negative hematology ROS (+)   Anesthesia Other Findings   Reproductive/Obstetrics negative OB ROS                             Anesthesia Physical Anesthesia Plan  ASA: 1  Anesthesia Plan: General   Post-op Pain Management: Minimal or no pain anticipated   Induction: Intravenous  PONV Risk Score and Plan: 3 and Ondansetron, Dexamethasone, Midazolam and Treatment may vary due to age or medical condition  Airway Management Planned: Oral ETT  Additional Equipment:   Intra-op Plan:   Post-operative Plan: Extubation in OR  Informed Consent: I have reviewed the patients History and Physical, chart, labs and discussed the procedure including the risks, benefits and alternatives for the proposed anesthesia with the patient or authorized representative who has indicated his/her understanding and acceptance.     Dental advisory given  Plan Discussed with: CRNA and Surgeon  Anesthesia Plan Comments:        Anesthesia Quick Evaluation

## 2022-12-18 NOTE — Progress Notes (Signed)
Carolyn Jefferson 11:45 AM  Subjective: Patient seen and examined and discussed with my partner Dr. Dulce Sellar in her hospital computer chart reviewed and she is currently doing well without any pain on pain medicine and we rediscussed the procedure and answered all of her questions  Objective: Vital signs stable afebrile no acute distress exam please see preassessment evaluation LFTs increased better than on admission CBC okay MRI reviewed ultrasound  reviewed CT reviewed  Assessment: CBD stones on MRCP  Plan: The risk benefits methods and success rate of ERCP were discussed and we will proceed today with anesthesia assistance  [facility]  office [phone] After 5PM or if no answer call [phone]

## 2022-12-18 NOTE — Progress Notes (Signed)
[facility]                                                                               Marco Molock, is a 32 y.o. female, DOB - 23-Sep-1990, ZOX:[id number] Admit date - 12/15/2022    Outpatient Primary MD for the patient is [facility]  LOS - 2  days    Brief summary   Carolyn Jefferson is a 32 y.o. female with no significant past medical history presenting with ongoing abdominal pain. Patient has had right upper quadrant abdominal pain starting 3 days ago.  On that day she had sudden and severe attack of pain and [facility]  There she had reassuring labs and CT scan showing gallstones but no cholecystitis.  Patient was discharged with pain medication. She is continue to have intermittent abdominal pain that was significant increase this morning.  Also is reporting some dark urine and possible jaundice.  Workup showed cholelithiasis and choledocholithiasis.  General surgery and gastroenterology were consulted    Assessment & Plan    Assessment and Plan:   Cholelithiasis, choledocholithiasis, elevated LFTs  -appreciate gastroenterology as well as general surgery input.   ERCP today.  -LFTs are stable/improving for now.      Estimated body mass index is 24.8 kg/m as calculated from the following:   Height as of 12/13/22: 5\' 3"  (1.6 m).   Weight as of 12/13/22: 63.5 kg.  Code Status: full code.  DVT Prophylaxis:  SCD's Start: 12/18/22 1358   Level of Care: Level of care: Med-Surg Family Communication: None   Procedures:  ERCP Consultants:   GI/ SURGERY  Antimicrobials:   Anti-infectives (From admission, onward)    Start     Dose/Rate Route Frequency Ordered Stop   12/19/22 0900  cefTRIAXone (ROCEPHIN) 2 g in sodium chloride 0.9 % 100 mL IVPB        2 g 200 mL/hr over 30 Minutes Intravenous On call to O.R. 12/18/22 1357 12/20/22 0559   12/18/22 1100  ciprofloxacin (CIPRO) IVPB 400 mg         400 mg 200 mL/hr over 60 Minutes Intravenous  Once 12/18/22 1057 12/18/22 1204        Medications  Scheduled Meds:  Chlorhexidine Gluconate Cloth  6 each Topical Once   And   Chlorhexidine Gluconate Cloth  6 each Topical Once   diclofenac  100 mg Rectal Once   indocyanine green  7.5 mg Intravenous Once   sodium chloride flush  3 mL Intravenous Q12H   Continuous Infusions:  [START ON 12/19/2022] cefTRIAXone (ROCEPHIN)  IV     lactated ringers Stopped (12/18/22 1312)   PRN Meds:.acetaminophen **OR** acetaminophen, oxyCODONE-acetaminophen, polyethylene glycol    Subjective:   Pansie Sontag was seen and examined today.  Slightly nauseated.   Objective:   Vitals:   12/18/22 1255 12/18/22 1300 12/18/22 1310 12/18/22 1322  BP: 132/87  116/81 106/78  Pulse: 83 69 63 64  Resp: (!) 9 12 17 14   Temp: (!) 96.8 F (36 C)     TempSrc: Temporal     SpO2: 98% 97% 97% 98%  Intake/Output Summary (Last 24 hours) at 12/18/2022 1455 Last data filed at 12/18/2022 1245 Gross per 24 hour  Intake 1080 ml  Output 0 ml  Net 1080 ml   There were no vitals filed for this visit.   Exam General: Alert and oriented x 3, NAD Cardiovascular: S1 S2 auscultated, no murmurs, RRR Respiratory: Clear to auscultation bilaterally, no wheezing, rales or rhonchi Gastrointestinal: Soft, nontender, nondistended, + bowel sounds Ext: no pedal edema bilaterally Neuro: AAOx3, Cr N's II- XII. Strength 5/5 upper and lower extremities bilaterally Skin: No rashes Psych: Normal affect and demeanor, alert and oriented x3    Data Reviewed:  I have personally reviewed following labs and imaging studies   CBC Lab Results  Component Value Date   WBC 7.7 12/17/2022   RBC 5.57 (H) 12/17/2022   HGB 14.5 12/17/2022   HCT 45.9 12/17/2022   MCV 82.4 12/17/2022   MCH 26.0 12/17/2022   PLT 330 12/17/2022   MCHC 31.6 12/17/2022   RDW 15.6 (H) 12/17/2022   LYMPHSABS 3.7 12/13/2022   MONOABS 0.8  12/13/2022   EOSABS 0.2 12/13/2022   BASOSABS 0.1 12/13/2022     Last metabolic panel Lab Results  Component Value Date   NA 135 12/18/2022   K 3.5 12/18/2022   CL 100 12/18/2022   CO2 25 12/18/2022   BUN 6 12/18/2022   CREATININE 0.78 12/18/2022   GLUCOSE 78 12/18/2022   GFRNONAA >60 12/18/2022   GFRAA 103 02/01/2019   CALCIUM 9.4 12/18/2022   PROT 7.2 12/18/2022   ALBUMIN 4.0 12/18/2022   LABGLOB 2.4 02/01/2019   AGRATIO 2.0 02/01/2019   BILITOT 1.6 (H) 12/18/2022   ALKPHOS 262 (H) 12/18/2022   AST 86 (H) 12/18/2022   ALT 299 (H) 12/18/2022   ANIONGAP 10 12/18/2022    CBG (last 3)  No results for input(s): "GLUCAP" in the last 72 hours.    Coagulation Profile: No results for input(s): "INR", "PROTIME" in the last 168 hours.   Radiology Studies: No results found.     Kathlen Mody M.D. [facility] 12/18/2022, 2:55 PM  Available via Epic secure chat 7am-7pm After 7 pm, please refer to night coverage provider listed on amion.

## 2022-12-18 NOTE — Transfer of Care (Signed)
Immediate Anesthesia Transfer of Care Note  Patient: Carolyn Jefferson  Procedure(s) Performed: ENDOSCOPIC RETROGRADE CHOLANGIOPANCREATOGRAPHY (ERCP) SPHINCTEROTOMY REMOVAL OF STONES PANCREATIC STENT PLACEMENT  Patient Location: Endoscopy Unit  Anesthesia Type:General  Level of Consciousness: drowsy and patient cooperative  Airway & Oxygen Therapy: Patient Spontanous Breathing  Post-op Assessment: Report given to RN, Post -op Vital signs reviewed and stable, and Patient moving all extremities X 4  Post vital signs: Reviewed and stable  Last Vitals:  Vitals Value Taken Time  BP 132/87 12/18/22 1255  Temp    Pulse 80 12/18/22 1256  Resp 9 12/18/22 1256  SpO2 98 % 12/18/22 1256  Vitals shown include unfiled device data.  Last Pain:  Vitals:   12/18/22 1058  TempSrc: Temporal  PainSc: 0-No pain         Complications: No notable events documented.

## 2022-12-18 NOTE — Progress Notes (Signed)
   Subjective/Chief Complaint: Pt with no acute changes Awaiting ERCP   Objective: Vital signs in last 24 hours: Temp:  [98.2 F (36.8 C)-98.8 F (37.1 C)] 98.4 F (36.9 C) (08/21 0716) Pulse Rate:  [67-77] 74 (08/21 0716) Resp:  [16-18] 16 (08/21 0716) BP: (98-109)/(69-74) 101/72 (08/21 0716) SpO2:  [98 %-100 %] 99 % (08/21 0716) Last BM Date : 12/17/22  Intake/Output from previous day: 08/20 0701 - 08/21 0700 In: 1200 [P.O.:1200] Out: -  Intake/Output this shift: No intake/output data recorded.  PE:  Constitutional: No acute distress, conversant, appears states age. Eyes: Anicteric sclerae, moist conjunctiva, no lid lag Lungs: Clear to auscultation bilaterally, normal respiratory effort CV: regular rate and rhythm, no murmurs, no peripheral edema, pedal pulses 2+ GI: Soft, no masses or hepatosplenomegaly, non-tender to palpation Skin: No rashes, palpation reveals normal turgor Psychiatric: appropriate judgment and insight, oriented to person, place, and time   Lab Results:  Recent Labs    12/16/22 2343 12/17/22 0944  WBC 7.6 7.7  HGB 14.0 14.5  HCT 44.7 45.9  PLT 315 330   BMET Recent Labs    12/17/22 0944 12/18/22 0104  NA 138 135  K 3.9 3.5  CL 102 100  CO2 24 25  GLUCOSE 91 78  BUN 8 6  CREATININE 0.89 0.78  CALCIUM 9.8 9.4   PT/INR No results for input(s): "LABPROT", "INR" in the last 72 hours. ABG No results for input(s): "PHART", "HCO3" in the last 72 hours.  Invalid input(s): "PCO2", "PO2"  Studies/Results: No results found.  Anti-infectives: Anti-infectives (From admission, onward)    None       Assessment/Plan: 89F Choledocholithiasis - MRCP with choledocholithiasis with small stones in the common bile duct measuring up to 2 mm, no intra or extrahepatic biliary ductal dilatation to suggest acute biliary obstruction at this time; no acute cholecystitis - labs pending this morning - Await GI plan regarding ERCP. She is NPO.  Ultimately plan for lap chole Thurs with liver biopsy once the common bile duct is investigated and or cleared.   ID - none FEN - IVF, NPO VTE - SCDs Foley - none      LOS: 2 days    Axel Filler 12/18/2022

## 2022-12-18 NOTE — Anesthesia Postprocedure Evaluation (Signed)
Anesthesia Post Note  Patient: Carolyn Jefferson  Procedure(Jefferson) Performed: ENDOSCOPIC RETROGRADE CHOLANGIOPANCREATOGRAPHY (ERCP) SPHINCTEROTOMY REMOVAL OF STONES PANCREATIC STENT PLACEMENT     Patient location during evaluation: PACU Anesthesia Type: General Level of consciousness: awake and alert Pain management: pain level controlled Vital Signs Assessment: post-procedure vital signs reviewed and stable Respiratory status: spontaneous breathing, nonlabored ventilation, respiratory function stable and patient connected to nasal cannula oxygen Cardiovascular status: blood pressure returned to baseline and stable Postop Assessment: no apparent nausea or vomiting Anesthetic complications: no  No notable events documented.  Last Vitals:  Vitals:   12/18/22 1310 12/18/22 1322  BP: 116/81 106/78  Pulse: 63 64  Resp: 17 14  Temp:    SpO2: 97% 98%    Last Pain:  Vitals:   12/18/22 1322  TempSrc:   PainSc: 0-No pain                 Carolyn Jefferson

## 2022-12-18 NOTE — Op Note (Signed)
[facility] Patient Name: Carolyn Jefferson Procedure Date : 12/18/2022 MRN: [id number] Attending MD: Vida Rigger , MD, [id number] Date of Birth: 1990/07/03 CSN: [id number] Age: 32 Admit Type: Inpatient Procedure:                ERCP Indications:              Common bile duct stone(s), Abnormal MRCP, Elevated                            liver enzymes Providers:                Vida Rigger, MD, Margaree Mackintosh, RN, Salley Scarlet, Technician, Kirstie Mirza Muqtasid, CRNA Referring MD:              Medicines:                General Anesthesia Complications:            No immediate complications. Estimated Blood Loss:     Estimated blood loss: none. Procedure:                Pre-Anesthesia Assessment:                           - Prior to the procedure, a History and Physical                            was performed, and patient medications and                            allergies were reviewed. The patient's tolerance of                            previous anesthesia was also reviewed. The risks                            and benefits of the procedure and the sedation                            options and risks were discussed with the patient.                            All questions were answered, and informed consent                            was obtained. Prior Anticoagulants: The patient has                            taken no anticoagulant or antiplatelet agents. ASA                            Grade Assessment: I - A normal, healthy patient.  After reviewing the risks and benefits, the patient                            was deemed in satisfactory condition to undergo the                            procedure.                           After obtaining informed consent, the scope was                            passed under direct vision. Throughout the                            procedure, the patient's blood  pressure, pulse, and                            oxygen saturations were monitored continuously. The                            TJF-Q190V ([id number]) Olympus duodenoscope was                            introduced through the mouth, and used to inject                            contrast into and used to locate the major papilla.                            The ERCP was somewhat difficult due to challenging                            cannulation. Successful completion of the procedure                            was aided by performing the maneuvers documented                            (below) in this report. The patient tolerated the                            procedure well. Scope In: Scope Out: Findings:      The major papilla was normal. Unfortunately on the first few wire       advancement despite repositioning the sphincterotome the wire went       towards the pancreas so we placed one 4 Fr by 3 cm temporary plastic       pancreatic stent with a 3/4 external pigtail and no internal flaps was       placed 2 cm into the ventral pancreatic duct. Clear fluid flowed through       the stent. The stent was in good position. We then were able to obtain       deep selective cannulation by cannulating next to the stent  in the       customary fashion and a biliary sphincterotomy was made with a Hydratome       sphincterotome using ERBE electrocautery. There was no       post-sphincterotomy bleeding. The sphincterotomy was continued until we       had adequate biliary drainage and could get the fully bowed       sphincterotome easily in and out of the duct and on initial       cholangiogram we thought we saw a small filling defect and to discover       objects, the biliary tree was swept with a 12 mm balloon starting at the       bifurcation. Nothing was found on multiple balloon pull-through's. And       nothing was found on occlusion cholangiogram at the end of the       procedure. Unfortunately  and pulling the balloon through the CBD which       did pass without much resistance the pancreatic stent fell into the       duodenum and that stent was removed from The duodenum using a Raptor       grasping device. We elected to replace that stent in the customary       fashion which was reinserted fairly readily and the same 4 Fr by 3 cm       stent with a 3/4 external pigtail and no internal flaps was placed 2 cm       into the ventral pancreatic duct. The stent was in good position. The       wire and sphincterotome were removed scope was removed and the patient       tolerated the procedure well and there was no dye injected into the       pancreas throughout the procedure Impression:               - The major papilla appeared normal.                           - One temporary plastic pancreatic stent was placed                            into the ventral pancreatic duct.                           - A biliary sphincterotomy was performed.                           - The biliary tree was swept multiple times and                            nothing was found.                           - One stent was removed from The duodenum.                           -The same stent was placed into the ventral  pancreatic duct. Recommendation:           - Clear liquid diet today. N.p.o. after midnight                            possible lap chole tomorrow                           - Check liver enzymes (AST, ALT, alkaline                            phosphatase, bilirubin) in the morning. Follow back                            to normal as an outpatient                           - Return to GI clinic PRN. And will need a flat and                            upright abdominal x-ray to make sure pancreatic                            duct stent passes on its own in 1 to 2 weeks as an                            outpatient                           - Telephone GI clinic if  symptomatic PRN.                           - Refer to a surgeon for probable lap chole                            tomorrow. Procedure Code(s):        --- Professional ---                           [phone], Esophagogastroduodenoscopy, flexible,                            transoral; with removal of foreign body(s) Diagnosis Code(s):        --- Professional ---                           Z46.59, Encounter for fitting and adjustment of                            other gastrointestinal appliance and device                           K80.50, Calculus of bile duct without cholangitis                            or  cholecystitis without obstruction                           R74.8, Abnormal levels of other serum enzymes                           R93.2, Abnormal findings on diagnostic imaging of                            liver and biliary tract CPT copyright 2022 [employer]. All rights reserved. The codes documented in this report are preliminary and upon coder review may  be revised to meet current compliance requirements. Vida Rigger, MD 12/18/2022 12:59:52 PM This report has been signed electronically. Number of Addenda: 0

## 2022-12-19 ENCOUNTER — Observation Stay (HOSPITAL_BASED_OUTPATIENT_CLINIC_OR_DEPARTMENT_OTHER): Payer: 59 | Admitting: Anesthesiology

## 2022-12-19 ENCOUNTER — Other Ambulatory Visit: Payer: Self-pay

## 2022-12-19 ENCOUNTER — Encounter (HOSPITAL_COMMUNITY)
Admission: EM | Disposition: A | Discharge status: Home / Self Care | Payer: Self-pay | Source: Home / Self Care | Attending: Emergency Medicine

## 2022-12-19 ENCOUNTER — Observation Stay (HOSPITAL_COMMUNITY): Payer: 59 | Admitting: Anesthesiology

## 2022-12-19 DIAGNOSIS — K8020 Calculus of gallbladder without cholecystitis without obstruction: Secondary | ICD-10-CM

## 2022-12-19 DIAGNOSIS — K8050 Calculus of bile duct without cholangitis or cholecystitis without obstruction: Secondary | ICD-10-CM | POA: Diagnosis not present

## 2022-12-19 DIAGNOSIS — K8021 Calculus of gallbladder without cholecystitis with obstruction: Secondary | ICD-10-CM | POA: Diagnosis not present

## 2022-12-19 HISTORY — PX: CHOLECYSTECTOMY: SHX55

## 2022-12-19 HISTORY — PX: DIAGNOSTIC LAPAROSCOPIC LIVER BIOPSY: SHX5797

## 2022-12-19 LAB — CBC WITH DIFFERENTIAL/PLATELET
Abs Immature Granulocytes: 0.05 10*3/uL (ref 0.00–0.07)
Basophils Absolute: 0.1 10*3/uL (ref 0.0–0.1)
Basophils Relative: 0 %
Eosinophils Absolute: 0 10*3/uL (ref 0.0–0.5)
Eosinophils Relative: 0 %
HCT: 39.9 % (ref 36.0–46.0)
Hemoglobin: 12.6 g/dL (ref 12.0–15.0)
Immature Granulocytes: 0 %
Lymphocytes Relative: 13 %
Lymphs Abs: 2 10*3/uL (ref 0.7–4.0)
MCH: 26.3 pg (ref 26.0–34.0)
MCHC: 31.6 g/dL (ref 30.0–36.0)
MCV: 83.1 fL (ref 80.0–100.0)
Monocytes Absolute: 1.4 10*3/uL — ABNORMAL HIGH (ref 0.1–1.0)
Monocytes Relative: 9 %
Neutro Abs: 12.2 10*3/uL — ABNORMAL HIGH (ref 1.7–7.7)
Neutrophils Relative %: 78 %
Platelets: 290 10*3/uL (ref 150–400)
RBC: 4.8 MIL/uL (ref 3.87–5.11)
RDW: 15.4 % (ref 11.5–15.5)
WBC: 15.7 10*3/uL — ABNORMAL HIGH (ref 4.0–10.5)
nRBC: 0 % (ref 0.0–0.2)

## 2022-12-19 LAB — COMPREHENSIVE METABOLIC PANEL
ALT: 182 U/L — ABNORMAL HIGH (ref 0–44)
AST: 57 U/L — ABNORMAL HIGH (ref 15–41)
Albumin: 3.5 g/dL (ref 3.5–5.0)
Alkaline Phosphatase: 202 U/L — ABNORMAL HIGH (ref 38–126)
Anion gap: 9 (ref 5–15)
BUN: 11 mg/dL (ref 6–20)
CO2: 23 mmol/L (ref 22–32)
Calcium: 9 mg/dL (ref 8.9–10.3)
Chloride: 107 mmol/L (ref 98–111)
Creatinine, Ser: 0.78 mg/dL (ref 0.44–1.00)
GFR, Estimated: >60 mL/min (ref >60)
Glucose, Bld: 91 mg/dL (ref 70–99)
Potassium: 3.8 mmol/L (ref 3.5–5.1)
Sodium: 139 mmol/L (ref 135–145)
Total Bilirubin: 0.8 mg/dL (ref 0.3–1.2)
Total Protein: 6.5 g/dL (ref 6.5–8.1)

## 2022-12-19 LAB — LIPASE, BLOOD: Lipase: 100 U/L — ABNORMAL HIGH (ref 11–51)

## 2022-12-19 LAB — MRSA NEXT GEN BY PCR, NASAL: MRSA by PCR Next Gen: NOT DETECTED

## 2022-12-19 SURGERY — LAPAROSCOPIC CHOLECYSTECTOMY
Anesthesia: General | Site: Abdomen

## 2022-12-19 MED ORDER — ORAL CARE MOUTH RINSE: 15.0000 mL | Freq: Once | OROMUCOSAL | Status: AC

## 2022-12-19 MED ORDER — GLYCOPYRROLATE PF 0.2 MG/ML IJ SOSY
PREFILLED_SYRINGE | INTRAMUSCULAR | Status: AC
  Filled 2022-12-19: qty 1

## 2022-12-19 MED ORDER — PHENYLEPHRINE 80 MCG/ML (10ML) SYRINGE FOR IV PUSH (FOR BLOOD PRESSURE SUPPORT)
PREFILLED_SYRINGE | INTRAVENOUS | Status: AC
  Filled 2022-12-19: qty 10

## 2022-12-19 MED ORDER — PROPOFOL 10 MG/ML IV BOLUS
INTRAVENOUS | Status: AC
  Filled 2022-12-19: qty 20

## 2022-12-19 MED ORDER — FENTANYL CITRATE (PF) 100 MCG/2ML IJ SOLN
25.0000 ug | INTRAMUSCULAR | Status: DC | PRN
  Administered 2022-12-19 (×2): 50 ug via INTRAVENOUS

## 2022-12-19 MED ORDER — FENTANYL CITRATE (PF) 100 MCG/2ML IJ SOLN
INTRAMUSCULAR | Status: AC
  Filled 2022-12-19: qty 2

## 2022-12-19 MED ORDER — SUGAMMADEX SODIUM 200 MG/2ML IV SOLN
INTRAVENOUS | Status: DC | PRN
  Administered 2022-12-19: 200 mg via INTRAVENOUS

## 2022-12-19 MED ORDER — SCOPOLAMINE 1 MG/3DAYS TD PT72
MEDICATED_PATCH | TRANSDERMAL | Status: AC
  Administered 2022-12-19: 1.5 mg via TRANSDERMAL
  Filled 2022-12-19: qty 1

## 2022-12-19 MED ORDER — ACETAMINOPHEN 500 MG PO TABS
ORAL_TABLET | ORAL | Status: AC
  Administered 2022-12-19: 1000 mg via ORAL
  Filled 2022-12-19: qty 2

## 2022-12-19 MED ORDER — LIDOCAINE 2% (20 MG/ML) 5 ML SYRINGE
INTRAMUSCULAR | Status: DC | PRN
  Administered 2022-12-19: 40 mg via INTRAVENOUS

## 2022-12-19 MED ORDER — SCOPOLAMINE 1 MG/3DAYS TD PT72
1.0000 | MEDICATED_PATCH | Freq: Once | TRANSDERMAL | Status: DC
  Filled 2022-12-19: qty 1

## 2022-12-19 MED ORDER — METHOCARBAMOL 500 MG PO TABS
500.0000 mg | ORAL_TABLET | Freq: Four times a day (QID) | ORAL | Status: DC | PRN
  Administered 2022-12-19: 500 mg via ORAL
  Filled 2022-12-19: qty 1

## 2022-12-19 MED ORDER — ONDANSETRON HCL 4 MG/2ML IJ SOLN
INTRAMUSCULAR | Status: AC
  Filled 2022-12-19: qty 2

## 2022-12-19 MED ORDER — SODIUM CHLORIDE 0.9 % IR SOLN
Status: DC | PRN
  Administered 2022-12-19: 1000 mL

## 2022-12-19 MED ORDER — BUPIVACAINE HCL 0.25 % IJ SOLN
INTRAMUSCULAR | Status: DC | PRN
  Administered 2022-12-19: 10 mL

## 2022-12-19 MED ORDER — ONDANSETRON HCL 4 MG/2ML IJ SOLN
INTRAMUSCULAR | Status: DC | PRN
  Administered 2022-12-19: 4 mg via INTRAVENOUS

## 2022-12-19 MED ORDER — ESMOLOL HCL 100 MG/10ML IV SOLN
INTRAVENOUS | Status: DC | PRN
  Administered 2022-12-19: 30 mg via INTRAVENOUS
  Administered 2022-12-19: 20 mg via INTRAVENOUS

## 2022-12-19 MED ORDER — BUPIVACAINE-EPINEPHRINE (PF) 0.25% -1:200000 IJ SOLN
INTRAMUSCULAR | Status: AC
  Filled 2022-12-19: qty 30

## 2022-12-19 MED ORDER — OXYCODONE HCL 5 MG PO TABS: 5.0000 mg | ORAL_TABLET | Freq: Once | ORAL | Status: DC | PRN

## 2022-12-19 MED ORDER — MIDAZOLAM HCL 2 MG/2ML IJ SOLN
INTRAMUSCULAR | Status: AC
  Filled 2022-12-19: qty 2

## 2022-12-19 MED ORDER — FENTANYL CITRATE (PF) 250 MCG/5ML IJ SOLN
INTRAMUSCULAR | Status: DC | PRN
  Administered 2022-12-19: 50 ug via INTRAVENOUS
  Administered 2022-12-19 (×2): 100 ug via INTRAVENOUS

## 2022-12-19 MED ORDER — ACETAMINOPHEN 500 MG PO TABS
1000.0000 mg | ORAL_TABLET | Freq: Once | ORAL | Status: AC
  Filled 2022-12-19: qty 2

## 2022-12-19 MED ORDER — ACETAMINOPHEN 500 MG PO TABS: 1000.0000 mg | ORAL_TABLET | Freq: Four times a day (QID) | ORAL | Status: DC | PRN

## 2022-12-19 MED ORDER — OXYCODONE HCL 5 MG/5ML PO SOLN: 5.0000 mg | Freq: Once | ORAL | Status: DC | PRN

## 2022-12-19 MED ORDER — CHLORHEXIDINE GLUCONATE 0.12 % MT SOLN
OROMUCOSAL | Status: AC
  Administered 2022-12-19: 15 mL via OROMUCOSAL
  Filled 2022-12-19: qty 15

## 2022-12-19 MED ORDER — OXYCODONE HCL 5 MG PO TABS
5.0000 mg | ORAL_TABLET | ORAL | Status: DC | PRN
  Administered 2022-12-19 (×2): 10 mg via ORAL
  Filled 2022-12-19 (×2): qty 2

## 2022-12-19 MED ORDER — CHLORHEXIDINE GLUCONATE 0.12 % MT SOLN
15.0000 mL | Freq: Once | OROMUCOSAL | Status: AC
  Filled 2022-12-19: qty 15

## 2022-12-19 MED ORDER — PROPOFOL 10 MG/ML IV BOLUS
INTRAVENOUS | Status: DC | PRN
  Administered 2022-12-19: 130 mg via INTRAVENOUS

## 2022-12-19 MED ORDER — KETOROLAC TROMETHAMINE 30 MG/ML IJ SOLN
30.0000 mg | Freq: Once | INTRAMUSCULAR | Status: AC | PRN
  Administered 2022-12-19: 30 mg via INTRAVENOUS

## 2022-12-19 MED ORDER — OXYCODONE HCL 5 MG PO TABS: 5.0000 mg | ORAL_TABLET | Freq: Four times a day (QID) | ORAL | 0 refills | Status: AC | PRN

## 2022-12-19 MED ORDER — GLYCOPYRROLATE PF 0.2 MG/ML IJ SOSY
PREFILLED_SYRINGE | INTRAMUSCULAR | Status: DC | PRN
Start: 2022-12-19 — End: 2022-12-19
  Administered 2022-12-19: .2 mg via INTRAVENOUS

## 2022-12-19 MED ORDER — ROCURONIUM BROMIDE 10 MG/ML (PF) SYRINGE
PREFILLED_SYRINGE | INTRAVENOUS | Status: AC
  Filled 2022-12-19: qty 10

## 2022-12-19 MED ORDER — MIDAZOLAM HCL 2 MG/2ML IJ SOLN
INTRAMUSCULAR | Status: DC | PRN
  Administered 2022-12-19: 2 mg via INTRAVENOUS

## 2022-12-19 MED ORDER — SODIUM CHLORIDE 0.9 % IV SOLN
INTRAVENOUS | Status: AC
  Filled 2022-12-19: qty 20

## 2022-12-19 MED ORDER — LIDOCAINE 2% (20 MG/ML) 5 ML SYRINGE
INTRAMUSCULAR | Status: AC
  Filled 2022-12-19: qty 5

## 2022-12-19 MED ORDER — ESMOLOL HCL 100 MG/10ML IV SOLN
INTRAVENOUS | Status: AC
  Filled 2022-12-19: qty 10

## 2022-12-19 MED ORDER — 0.9 % SODIUM CHLORIDE (POUR BTL) OPTIME
TOPICAL | Status: DC | PRN
  Administered 2022-12-19: 1000 mL

## 2022-12-19 MED ORDER — FENTANYL CITRATE (PF) 250 MCG/5ML IJ SOLN
INTRAMUSCULAR | Status: AC
  Filled 2022-12-19: qty 5

## 2022-12-19 MED ORDER — PHENYLEPHRINE 80 MCG/ML (10ML) SYRINGE FOR IV PUSH (FOR BLOOD PRESSURE SUPPORT)
PREFILLED_SYRINGE | INTRAVENOUS | Status: DC | PRN
  Administered 2022-12-19 (×3): 80 ug via INTRAVENOUS

## 2022-12-19 MED ORDER — ROCURONIUM BROMIDE 10 MG/ML (PF) SYRINGE
PREFILLED_SYRINGE | INTRAVENOUS | Status: DC | PRN
  Administered 2022-12-19: 50 mg via INTRAVENOUS

## 2022-12-19 MED ORDER — ONDANSETRON HCL 4 MG/2ML IJ SOLN: 4.0000 mg | Freq: Once | INTRAMUSCULAR | Status: DC | PRN

## 2022-12-19 MED ORDER — LACTATED RINGERS IV SOLN: INTRAVENOUS | Status: DC

## 2022-12-19 MED ORDER — KETOROLAC TROMETHAMINE 30 MG/ML IJ SOLN
INTRAMUSCULAR | Status: AC
  Filled 2022-12-19: qty 1

## 2022-12-19 MED ORDER — ONDANSETRON HCL 4 MG/2ML IJ SOLN
4.0000 mg | Freq: Four times a day (QID) | INTRAMUSCULAR | Status: DC | PRN
  Administered 2022-12-19: 4 mg via INTRAVENOUS
  Filled 2022-12-19: qty 2

## 2022-12-19 SURGICAL SUPPLY — 45 items
ADH SKN CLS APL DERMABOND .7 (GAUZE/BANDAGES/DRESSINGS) ×1
APL PRP STRL LF DISP 70% ISPRP (MISCELLANEOUS) ×1
BAG COUNTER SPONGE SURGICOUNT (BAG) ×1 IMPLANT
BAG SPEC RTRVL 10 TROC 200 (ENDOMECHANICALS) ×1
BAG SPNG CNTER NS LX DISP (BAG) ×1
CANISTER SUCT 3000ML PPV (MISCELLANEOUS) ×1 IMPLANT
CHLORAPREP W/TINT 26 (MISCELLANEOUS) ×1 IMPLANT
CLIP LIGATING HEMO O LOK GREEN (MISCELLANEOUS) ×1 IMPLANT
CNTNR URN SCR LID CUP LEK RST (MISCELLANEOUS) IMPLANT
CONT SPEC 4OZ STRL OR WHT (MISCELLANEOUS) ×2
COVER SURGICAL LIGHT HANDLE (MISCELLANEOUS) ×1 IMPLANT
COVER TRANSDUCER ULTRASND (DRAPES) ×1 IMPLANT
DERMABOND ADVANCED .7 DNX12 (GAUZE/BANDAGES/DRESSINGS) ×1 IMPLANT
ELECT REM PT RETURN 9FT ADLT (ELECTROSURGICAL) ×1
ELECTRODE REM PT RTRN 9FT ADLT (ELECTROSURGICAL) ×1 IMPLANT
GLOVE BIO SURGEON STRL SZ7.5 (GLOVE) ×2 IMPLANT
GOWN STRL REUS W/ TWL LRG LVL3 (GOWN DISPOSABLE) ×2 IMPLANT
GOWN STRL REUS W/ TWL XL LVL3 (GOWN DISPOSABLE) ×1 IMPLANT
GOWN STRL REUS W/TWL LRG LVL3 (GOWN DISPOSABLE) ×2
GOWN STRL REUS W/TWL XL LVL3 (GOWN DISPOSABLE) ×1
GRASPER SUT TROCAR 14GX15 (MISCELLANEOUS) ×1 IMPLANT
IRRIG SUCT STRYKERFLOW 2 WTIP (MISCELLANEOUS) ×1
IRRIGATION SUCT STRKRFLW 2 WTP (MISCELLANEOUS) ×1 IMPLANT
KIT BASIN OR (CUSTOM PROCEDURE TRAY) ×1 IMPLANT
KIT IMAGING PINPOINTPAQ (MISCELLANEOUS) IMPLANT
KIT TURNOVER KIT B (KITS) ×1 IMPLANT
NDL INSUFFLATION 14GA 120MM (NEEDLE) ×1 IMPLANT
NEEDLE INSUFFLATION 14GA 120MM (NEEDLE) ×1 IMPLANT
NS IRRIG 1000ML POUR BTL (IV SOLUTION) ×1 IMPLANT
PAD ARMBOARD 7.5X6 YLW CONV (MISCELLANEOUS) ×1 IMPLANT
POUCH LAPAROSCOPIC INSTRUMENT (MISCELLANEOUS) ×1 IMPLANT
POUCH RETRIEVAL ECOSAC 10 (ENDOMECHANICALS) IMPLANT
SCISSORS LAP 5X35 DISP (ENDOMECHANICALS) ×1 IMPLANT
SET TUBE SMOKE EVAC HIGH FLOW (TUBING) ×1 IMPLANT
SLEEVE Z-THREAD 5X100MM (TROCAR) ×1 IMPLANT
SPECIMEN JAR SMALL (MISCELLANEOUS) ×1 IMPLANT
SUT MNCRL AB 4-0 PS2 18 (SUTURE) ×1 IMPLANT
SUT VICRYL 0 UR6 27IN ABS (SUTURE) IMPLANT
TOWEL GREEN STERILE (TOWEL DISPOSABLE) ×1 IMPLANT
TOWEL GREEN STERILE FF (TOWEL DISPOSABLE) ×1 IMPLANT
TRAY LAPAROSCOPIC MC (CUSTOM PROCEDURE TRAY) ×1 IMPLANT
TROCAR 11X100 Z THREAD (TROCAR) ×1 IMPLANT
TROCAR Z-THREAD OPTICAL 5X100M (TROCAR) ×1 IMPLANT
WARMER LAPAROSCOPE (MISCELLANEOUS) ×1 IMPLANT
WATER STERILE IRR 1000ML POUR (IV SOLUTION) ×1 IMPLANT

## 2022-12-19 NOTE — Anesthesia Preprocedure Evaluation (Signed)
Anesthesia Evaluation  Patient identified by MRN, date of birth, ID band Patient awake    Reviewed: Allergy & Precautions, NPO status , Patient's Chart, lab work & pertinent test results  Airway Mallampati: II  TM Distance: >3 FB Neck ROM: Full    Dental  (+) Teeth Intact, Dental Advisory Given   Pulmonary neg pulmonary ROS   Pulmonary exam normal breath sounds clear to auscultation       Cardiovascular negative cardio ROS Normal cardiovascular exam Rhythm:Regular Rate:Normal     Neuro/Psych  PSYCHIATRIC DISORDERS  Depression    negative neurological ROS     GI/Hepatic ,GERD  Medicated,,Cholelithiasis   Endo/Other  negative endocrine ROS    Renal/GU negative Renal ROS     Musculoskeletal negative musculoskeletal ROS (+)    Abdominal   Peds  Hematology negative hematology ROS (+)   Anesthesia Other Findings Day of surgery medications reviewed with the patient.  Reproductive/Obstetrics                              Anesthesia Physical Anesthesia Plan  ASA: 2  Anesthesia Plan: General   Post-op Pain Management: Tylenol PO (pre-op)* and Toradol IV (intra-op)*   Induction: Intravenous  PONV Risk Score and Plan: 4 or greater and Scopolamine patch - Pre-op, Midazolam, Dexamethasone and Ondansetron  Airway Management Planned: Oral ETT  Additional Equipment:   Intra-op Plan:   Post-operative Plan: Extubation in OR  Informed Consent: I have reviewed the patients History and Physical, chart, labs and discussed the procedure including the risks, benefits and alternatives for the proposed anesthesia with the patient or authorized representative who has indicated his/her understanding and acceptance.     Dental advisory given  Plan Discussed with: CRNA  Anesthesia Plan Comments:          Anesthesia Quick Evaluation

## 2022-12-19 NOTE — Discharge Instructions (Signed)

## 2022-12-19 NOTE — Transfer of Care (Signed)
Immediate Anesthesia Transfer of Care Note  Patient: Carolyn Jefferson  Procedure(s) Performed: LAPAROSCOPIC CHOLECYSTECTOMY (Abdomen) DIAGNOSTIC LAPAROSCOPIC LIVER BIOPSY (Abdomen)  Patient Location: PACU  Anesthesia Type:General  Level of Consciousness: awake, drowsy, patient cooperative, and responds to stimulation  Airway & Oxygen Therapy: Patient Spontanous Breathing and Patient connected to nasal cannula oxygen  Post-op Assessment: Report given to RN and Post -op Vital signs reviewed and stable  Post vital signs: Reviewed and stable  Last Vitals:  Vitals Value Taken Time  BP    Temp    Pulse 98 12/19/22 0957  Resp 17 12/19/22 0957  SpO2 100 % 12/19/22 0957  Vitals shown include unfiled device data.  Last Pain:  Vitals:   12/19/22 0808  TempSrc: Oral  PainSc: 0-No pain         Complications: No notable events documented.

## 2022-12-19 NOTE — Progress Notes (Signed)
Patient s/p lap choley. Awake and alert, on RA. 3 trocar sites, skin glue, dry/intact. Pain management, tolerating CLD. Diet advanced. Patient is DTV at this time.

## 2022-12-19 NOTE — Op Note (Signed)
12/19/2022  9:44 AM  PATIENT:  Carolyn Jefferson  32 y.o. female  PRE-OPERATIVE DIAGNOSIS:  Cholelithiasis, choledocholithiasis  POST-OPERATIVE DIAGNOSIS:  Cholelithiasis, choledocholithiasis  PROCEDURE:  Procedure(s): LAPAROSCOPIC CHOLECYSTECTOMY (N/A) LAPAROSCOPIC LIVER BIOPSY (N/A)  SURGEON:  Surgeons and Role:    Axel Filler, MD - Primary    ANESTHESIA:   local and general  EBL:  minimal   BLOOD ADMINISTERED:none  DRAINS: none   LOCAL MEDICATIONS USED:  BUPIVICAINE   SPECIMEN:  Source of Specimen:  gallbladder and liver bx  DISPOSITION OF SPECIMEN:  PATHOLOGY  COUNTS:  YES  TOURNIQUET:  * No tourniquets in log *  DICTATION: .Dragon Dictation  The patient was taken to the operating and placed in the supine position with bilateral SCDs in place.  The patient was prepped and draped in the usual sterile fashion. A time out was called and all facts were verified. A pneumoperitoneum was obtained via A Veress needle technique to a pressure of 14mm of mercury.  A 5mm trochar was then placed in the right upper quadrant under visualization, and there were no injuries to any abdominal organs. A 11 mm port was then placed in the umbilical region after infiltrating with local anesthesia under direct visualization. A second and third epigastric port and right lower quadrant port placement under direct visualization, respectively.    The gallbladder was identified and retracted, the peritoneum was then sharply dissected from the gallbladder and this dissection was carried down to Calot's triangle. The cystic duct was identified and stripped away circumferentially and seen going into the gallbladder 360, the critical angle was obtained.  2 clips were placed proximally one distally and the cystic duct transected. The cystic artery was identified and 2 clips placed proximally and one distally and transected.  We then proceeded to remove the gallbladder off the hepatic fossa with  Bovie cautery. A retrieval bag was then placed in the abdomen and gallbladder placed in the bag. The hepatic fossa was then reexamined and hemostasis was achieved with Bovie cautery and was excellent at the end of the case.   Cautery was used to maintain hemostasis and the liver biopsy was obtained using biopsy forceps.  This was sent to pathology fresh.  The subhepatic fossa and perihepatic fossa was then irrigated until the effluent was clear.  The gallbladder and bag were removed from the abdominal cavity. The 11 mm trocar fascia was reapproximated with the Endo Close #1 Vicryl x2.  The pneumoperitoneum was evacuated and all trochars removed under direct visulalization.  The skin was then closed with 4-0 Monocryl and the skin dressed with Dermabond.    The patient was awaken from general anesthesia and taken to the recovery room in stable condition.   PLAN OF CARE: Admit to inpatient   PATIENT DISPOSITION:  PACU - hemodynamically stable.   Delay start of Pharmacological VTE agent (>24hrs) due to surgical blood loss or risk of bleeding: not applicable

## 2022-12-19 NOTE — Anesthesia Procedure Notes (Signed)
Procedure Name: Intubation Date/Time: 12/19/2022 8:59 AM  Performed by: Ayesha Rumpf, CRNAPre-anesthesia Checklist: Patient identified, Emergency Drugs available, Suction available and Patient being monitored Patient Re-evaluated:Patient Re-evaluated prior to induction Oxygen Delivery Method: Circle System Utilized Preoxygenation: Pre-oxygenation with 100% oxygen Induction Type: IV induction Ventilation: Mask ventilation without difficulty Laryngoscope Size: Mac and 3 Grade View: Grade II Tube type: Oral Tube size: 7.0 mm Number of attempts: 1 Airway Equipment and Method: Stylet and Oral airway Placement Confirmation: ETT inserted through vocal cords under direct vision, positive ETCO2 and breath sounds checked- equal and bilateral Secured at: 20 cm Tube secured with: Tape Dental Injury: Teeth and Oropharynx as per pre-operative assessment

## 2022-12-19 NOTE — Progress Notes (Signed)
Patient ambulated to bathroom to void.

## 2022-12-19 NOTE — Progress Notes (Signed)
Day of Surgery   Subjective/Chief Complaint: PT with no acute events ERCP results noted   Objective: Vital signs in last 24 hours: Temp:  [96.8 F (36 C)-98.4 F (36.9 C)] 98.4 F (36.9 C) (08/22 0808) Pulse Rate:  [58-83] 67 (08/22 0808) Resp:  [9-17] 17 (08/22 0808) BP: (103-132)/(66-87) 115/75 (08/22 0808) SpO2:  [97 %-100 %] 98 % (08/22 0808) Weight:  [63.5 kg] 63.5 kg (08/22 0808) Last BM Date : 12/17/22  Intake/Output from previous day: 08/21 0701 - 08/22 0700 In: 600 [I.V.:400; IV Piggyback:200] Out: 0  Intake/Output this shift: No intake/output data recorded.  PE:  Constitutional: No acute distress, conversant, appears states age. Eyes: Anicteric sclerae, moist conjunctiva, no lid lag Lungs: Clear to auscultation bilaterally, normal respiratory effort CV: regular rate and rhythm, no murmurs, no peripheral edema, pedal pulses 2+ GI: Soft, no masses or hepatosplenomegaly, non-tender to palpation Skin: No rashes, palpation reveals normal turgor Psychiatric: appropriate judgment and insight, oriented to person, place, and time   Lab Results:  Recent Labs    12/16/22 2343 12/17/22 0944  WBC 7.6 7.7  HGB 14.0 14.5  HCT 44.7 45.9  PLT 315 330   BMET Recent Labs    12/17/22 0944 12/18/22 0104  NA 138 135  K 3.9 3.5  CL 102 100  CO2 24 25  GLUCOSE 91 78  BUN 8 6  CREATININE 0.89 0.78  CALCIUM 9.8 9.4   PT/INR No results for input(s): "LABPROT", "INR" in the last 72 hours. ABG No results for input(s): "PHART", "HCO3" in the last 72 hours.  Invalid input(s): "PCO2", "PO2"  Studies/Results: No results found.  Anti-infectives: Anti-infectives (From admission, onward)    Start     Dose/Rate Route Frequency Ordered Stop   12/19/22 0900  cefTRIAXone (ROCEPHIN) 2 g in sodium chloride 0.9 % 100 mL IVPB        2 g 200 mL/hr over 30 Minutes Intravenous On call to O.R. 12/18/22 1357 12/20/22 0559   12/19/22 0802  sodium chloride 0.9 % with cefTRIAXone  (ROCEPHIN) ADS Med  Status:  Discontinued       Note to Pharmacy: Isabel Caprice, Destiny: cabinet override      12/19/22 0802 12/19/22 0828   12/18/22 1100  ciprofloxacin (CIPRO) IVPB 400 mg        400 mg 200 mL/hr over 60 Minutes Intravenous  Once 12/18/22 1057 12/18/22 1204       Assessment/Plan: 32 year old female choledocholithiasis, cholelithiasis 1.  Will proceed to the operating for a lap chole 2. All risks and benefits were discussed with the patient to generally include: infection, bleeding, possible need for post op ERCP, damage to the bile ducts, and bile leak. Alternatives were offered and described.  All questions were answered and the patient voiced understanding of the procedure and wishes to proceed at this point with a laparoscopic cholecystectomy   LOS: 2 days    Axel Filler 12/19/2022

## 2022-12-19 NOTE — Progress Notes (Signed)
Pt discharged to home in stable condition, all discharge instructions, appointments and prescriptions reviewed with pt and husband, pt transported off unit via wheelchair with staff x1 at chairside to private vehicle to transport home.

## 2022-12-19 NOTE — Plan of Care (Signed)

## 2022-12-20 ENCOUNTER — Encounter (HOSPITAL_COMMUNITY): Payer: Self-pay | Admitting: General Surgery

## 2022-12-20 NOTE — Discharge Summary (Signed)
Physician Discharge Summary   Patient: Carolyn Jefferson MRN: [id number] DOB: Jul 08, 1990  Admit date:     12/15/2022  Discharge date: 12/19/2022  Discharge Physician: Kathlen Mody   PCP: Sheliah Hatch, PA-C   Recommendations at discharge:  Please follow up with PCP in one week.  Please follow up with gen surgery as recommended.    Discharge Diagnoses: Principal Problem:   Cholestasis Active Problems:   Cholelithiasis   Transaminitis   Choledocholithiasis   Hospital Course: Carolyn Jefferson is a 32 y.o. female with no significant past medical history presenting with ongoing abdominal pain. Patient has had right upper quadrant abdominal pain starting 3 days ago.  On that day she had sudden and severe attack of pain and [facility].  There she had reassuring labs and CT scan showing gallstones but no cholecystitis.  Patient was discharged with pain medication. She is continue to have intermittent abdominal pain that was significant increase this morning.  Also is reporting some dark urine and possible jaundice.  Workup showed cholelithiasis and choledocholithiasis.  General surgery and gastroenterology were consulted     Assessment and Plan:   Cholelithiasis, choledocholithiasis, elevated LFTs  -appreciate gastroenterology as well as general surgery input.   S/p ERCP by GI.  Gen surgery consulted, and she underwent lap cholecystectomy.  -LFTs are stable/improving for now. - started on clears and advanced and tolerated.  Cleared for discharge.       Consultants: GI General surgery.  Procedures performed: lap chole Disposition: Home Diet recommendation:  Discharge Diet Orders (From admission, onward)     Start     Ordered   12/19/22 0000  Diet - low sodium heart healthy        12/19/22 1808           Regular diet DISCHARGE MEDICATION: Allergies as of 12/19/2022   No Known Allergies      Medication List     STOP  taking these medications    oxyCODONE-acetaminophen 5-325 MG tablet Commonly known as: PERCOCET/ROXICET       TAKE these medications    DUAL ACTION COMPLETE PO Take 1 tablet by mouth every 4 (four) hours as needed (pain).   oxyCODONE 5 MG immediate release tablet Commonly known as: Oxy IR/ROXICODONE Take 1 tablet (5 mg total) by mouth every 6 (six) hours as needed for breakthrough pain.        Follow-up Information     Maczis, Hedda Slade, PA-C Follow up.   Specialty: General Surgery Why: 9/12 at 2pm. Please bring a copy of your photo ID, insurance card and arrive 30 minutes prior to your appointment Contact information: [address] [place] [place] [address] [phone]                Discharge Exam: Ceasar Mons Weights   12/19/22 0808  Weight: 63.5 kg   General exam: Appears calm and comfortable  Respiratory system: Clear to auscultation. Respiratory effort normal. Cardiovascular system: S1 & S2 heard, RRR. No JVD, murmurs, rubs, gallops or clicks. No pedal edema. Gastrointestinal system: Abdomen is nondistended, soft and nontender. No organomegaly or masses felt. Normal bowel sounds heard. Central nervous system: Alert and oriented. No focal neurological deficits. Extremities: Symmetric 5 x 5 power. Skin: No rashes, lesions or ulcers Psychiatry: Judgement and insight appear normal. Mood & affect appropriate.    Condition at discharge: fair  The results of significant diagnostics from this hospitalization (including imaging, microbiology, ancillary and  laboratory) are listed below for reference.   Imaging Studies: DG ERCP  Result Date: 12/19/2022 CLINICAL DATA:  Bile duct stone. EXAM: ERCP TECHNIQUE: Multiple spot images obtained with the fluoroscopic device and submitted for interpretation post-procedure. FLUOROSCOPY TIME: FLUOROSCOPY TIME 2 minutes, 21 seconds (16.9 mGy) COMPARISON:  MRCP-12/15/2022; right upper quadrant abdominal  ultrasound-12/15/2022; CT abdomen and pelvis-12/13/2022 FINDINGS: Four spot intraoperative fluoroscopic images of the right upper abdominal quadrant during ERCP are provided for review Initial image demonstrates an ERCP probe overlying the right upper abdominal quadrant. A stent overlies the expected location of the pancreatic duct. Subsequent images demonstrate selective cannulation and opacification of the common bile duct which appears nondilated. Subsequent images demonstrate insufflation of a balloon within distal aspect of the CBD with presumed biliary sweeping and sphincterotomy. There is minimal opacification of the intrahepatic biliary tree which appears nondilated. There is opacification of the cystic duct with partial opacification of the gallbladder lumen. There is no definitive opacification of the pancreatic duct. IMPRESSION: ERCP with biliary sweeping and presumed sphincterotomy as above. These images were submitted for radiologic interpretation only. Please see the procedural report for the amount of contrast and the fluoroscopy time utilized. Electronically Signed   By: Simonne Come M.D.   On: 12/19/2022 11:35   MR ABDOMEN MRCP W WO CONTAST  Result Date: 12/16/2022 CLINICAL DATA:  32 year old female with cholelithiasis. Right upper quadrant abdominal pain. EXAM: MRI ABDOMEN WITHOUT AND WITH CONTRAST (INCLUDING MRCP) TECHNIQUE: Multiplanar multisequence MR imaging of the abdomen was performed both before and after the administration of intravenous contrast. Heavily T2-weighted images of the biliary and pancreatic ducts were obtained, and three-dimensional MRCP images were rendered by post processing. CONTRAST:  6mL GADAVIST GADOBUTROL 1 MMOL/ML IV SOLN COMPARISON:  CT of the abdomen and pelvis 12/13/2022. Right upper quadrant abdominal ultrasound 12/15/2022. FINDINGS: Lower chest: Unremarkable. Hepatobiliary: Liver appears enlarged measuring up to 19.6 cm in craniocaudal span. Liver contour is  mildly nodular, which could suggest early cirrhosis. No suspicious cystic or solid hepatic lesions. Mild diffuse increased T2 signal intensity in the periportal aspects of the liver suggesting periportal edema. MRCP images demonstrate no intra or extrahepatic biliary ductal dilatation. Common bile duct measures up to 5 mm in the porta hepatis. However, there are tiny filling defects in the distal common bile duct measuring up to 2 mm in size, compatible with choledocholithiasis. Additionally, there are multiple tiny filling defects lying dependently in the gallbladder, compatible with cholelithiasis. Gallbladder is moderately distended. Gallbladder wall thickness is normal. No definite pericholecystic fluid or surrounding inflammatory changes. Pancreas: No pancreatic mass. No pancreatic ductal dilatation on MRCP images. No pancreatic or peripancreatic fluid collections or inflammatory changes. Spleen:  Unremarkable. Adrenals/Urinary Tract: Bilateral kidneys and adrenal glands are normal in appearance. No hydroureteronephrosis in the visualized portions of the abdomen. Stomach/Bowel: Visualized portions are unremarkable. Vascular/Lymphatic: No aneurysm identified in the visualized abdominal vasculature. No lymphadenopathy noted in the abdomen. Other: No significant volume of ascites noted in the visualized portions of the peritoneal cavity. Musculoskeletal: No aggressive appearing osseous lesions are noted in the visualized portions of the skeleton. IMPRESSION: 1. Study is positive for cholelithiasis without imaging findings to suggest acute cholecystitis at this time. 2. There is also choledocholithiasis with small stones in the common bile duct measuring up to 2 mm. No intra or extrahepatic biliary ductal dilatation to suggest acute biliary obstruction at this time. 3. Hepatomegaly with evidence of periportal edema in the liver. This is a nonspecific finding, but correlation for  signs and symptoms of acute  hepatitis is recommended. 4. The liver also has a subtle nodular contour, which could suggest early changes of cirrhosis. Electronically Signed   By: Trudie Reed M.D.   On: 12/16/2022 05:29   US Abdomen Limited RUQ (LIVER/GB)  Result Date: 12/15/2022 CLINICAL DATA:  Right upper quadrant pain EXAM: ULTRASOUND ABDOMEN LIMITED RIGHT UPPER QUADRANT COMPARISON:  CT AP from 12/13/2022 FINDINGS: Gallbladder: Scattered shadowing stones identified within the dependent portion of the gallbladder which measure up to 5 mm. No gallbladder wall thickening, gallbladder sludge or pericholecystic fluid. Positive sonographic Murphy's sign reported by sonographer. Common bile duct: Diameter: 4.3 mm Liver: No focal lesion identified. Within normal limits in parenchymal echogenicity. Portal vein is patent on color Doppler imaging with normal direction of blood flow towards the liver. Other: None. IMPRESSION: Cholelithiasis. Positive sonographic Murphy's sign reported by sonographer. No gallbladder wall thickening, gallbladder sludge or pericholecystic fluid. Electronically Signed   By: Signa Kell M.D.   On: 12/15/2022 14:16   CT ABDOMEN PELVIS W CONTRAST  Result Date: 12/13/2022 CLINICAL DATA:  Acute, nonlocalized abdominal pain EXAM: CT ABDOMEN AND PELVIS WITH CONTRAST TECHNIQUE: Multidetector CT imaging of the abdomen and pelvis was performed using the standard protocol following bolus administration of intravenous contrast. RADIATION DOSE REDUCTION: This exam was performed according to the departmental dose-optimization program which includes automated exposure control, adjustment of the mA and/or kV according to patient size and/or use of iterative reconstruction technique. CONTRAST:  80mL OMNIPAQUE IOHEXOL 300 MG/ML  SOLN COMPARISON:  None Available. FINDINGS: Lower chest:  No contributory findings. Hepatobiliary: No focal liver abnormality.Calcified gallstones. No evidence of biliary obstruction or inflammation.  Pancreas: Unremarkable. Spleen: Unremarkable. Adrenals/Urinary Tract: Negative adrenals. No hydronephrosis or ureteral stone. Punctate calculus at the lower pole left kidney. Unremarkable bladder. Stomach/Bowel:  No obstruction. No appendicitis.  Moderate stool. Vascular/Lymphatic: No acute vascular abnormality. No mass or adenopathy. Reproductive:No pathologic findings. Other: No ascites or pneumoperitoneum. Musculoskeletal: No acute abnormalities. IMPRESSION: 1. No acute finding.  Moderate generalized stool. 2. Cholelithiasis and punctate left nephrolithiasis. Electronically Signed   By: Tiburcio Pea M.D.   On: 12/13/2022 06:24    Microbiology: Results for orders placed or performed during the hospital encounter of 12/15/22  MRSA Next Gen by PCR, Nasal     Status: None   Collection Time: 12/19/22  4:54 AM   Specimen: Nasal Mucosa; Nasal Swab  Result Value Ref Range Status   MRSA by PCR Next Gen NOT DETECTED NOT DETECTED Final    Comment: (NOTE) The GeneXpert MRSA Assay (FDA approved for NASAL specimens only), is one component of a comprehensive MRSA colonization surveillance program. It is not intended to diagnose MRSA infection nor to guide or monitor treatment for MRSA infections. Test performance is not FDA approved in patients less than 64 years old. Performed at [facility], [address], [place], [place] [address]     Labs: CBC: Recent Labs  Lab 12/15/22 1242 12/16/22 0710 12/16/22 2343 12/17/22 0944 12/19/22 1254  WBC 8.0 7.4 7.6 7.7 15.7*  NEUTROABS  --   --   --   --  12.2*  HGB 13.6 13.8 14.0 14.5 12.6  HCT 44.1 44.3 44.7 45.9 39.9  MCV 86.3 84.9 84.5 82.4 83.1  PLT 308 291 315 330 290   Basic Metabolic Panel: Recent Labs  Lab 12/16/22 0710 12/16/22 2343 12/17/22 0944 12/18/22 0104 12/19/22 1254  NA 139 137 138 135 139  K 4.1 3.9 3.9  3.5 3.8  CL 102 100 102 100 107  CO2 26 26 24 25 23   GLUCOSE 87 78 91 78 91  BUN 6 7 8 6 11   CREATININE 0.85  0.88 0.89 0.78 0.78  CALCIUM 9.5 9.4 9.8 9.4 9.0  MG  --  2.1  --   --   --    Liver Function Tests: Recent Labs  Lab 12/16/22 0710 12/16/22 2343 12/17/22 0944 12/18/22 0104 12/19/22 1254  AST 196* 143* 141* 86* 57*  ALT 491* 390* 372* 299* 182*  ALKPHOS 290* 288* 287* 262* 202*  BILITOT 2.8* 3.0* 2.9* 1.6* 0.8  PROT 7.4 7.6 7.8 7.2 6.5  ALBUMIN 4.0 4.1 4.2 4.0 3.5   CBG: No results for input(s): "GLUCAP" in the last 168 hours.  Discharge time spent: 40 minutes   Signed: Kathlen Mody, MD [facility] 12/20/2022

## 2022-12-20 NOTE — Anesthesia Postprocedure Evaluation (Signed)
Anesthesia Post Note  Patient: Carolyn Jefferson  Procedure(s) Performed: LAPAROSCOPIC CHOLECYSTECTOMY (Abdomen) DIAGNOSTIC LAPAROSCOPIC LIVER BIOPSY (Abdomen)     Patient location during evaluation: PACU Anesthesia Type: General Level of consciousness: awake and alert Pain management: pain level controlled Vital Signs Assessment: post-procedure vital signs reviewed and stable Respiratory status: spontaneous breathing, nonlabored ventilation, respiratory function stable and patient connected to nasal cannula oxygen Cardiovascular status: blood pressure returned to baseline and stable Postop Assessment: no apparent nausea or vomiting Anesthetic complications: no   No notable events documented.  Last Vitals:  Vitals:   12/19/22 1041 12/19/22 1632  BP: (!) 128/91 (!) 101/55  Pulse: 79 (!) 51  Resp: 19 16  Temp: 36.4 C 37.1 C  SpO2: 100% 98%    Last Pain:  Vitals:   12/19/22 1632  TempSrc: Oral  PainSc:                  Collene Schlichter

## 2022-12-21 ENCOUNTER — Encounter (HOSPITAL_COMMUNITY): Payer: Self-pay | Admitting: Gastroenterology

## 2022-12-23 LAB — SURGICAL PATHOLOGY

## 2023-01-17 ENCOUNTER — Other Ambulatory Visit: Payer: Self-pay | Admitting: Gastroenterology

## 2023-01-17 ENCOUNTER — Ambulatory Visit
Admission: RE | Admit: 2023-01-17 | Discharge: 2023-01-17 | Disposition: A | Discharge status: Home / Self Care | Payer: 59 | Source: Ambulatory Visit | Attending: Gastroenterology | Admitting: Gastroenterology

## 2023-01-17 DIAGNOSIS — Z9049 Acquired absence of other specified parts of digestive tract: Secondary | ICD-10-CM

## 2023-06-04 IMAGING — US US PELVIS COMPLETE WITH TRANSVAGINAL
1 series · 13 of 25 positions shown · non-contrast
Comparison: None Available.

CLINICAL DATA: Prolonged menstruation x1 month

EXAM:
TRANSABDOMINAL AND TRANSVAGINAL ULTRASOUND OF PELVIS
TECHNIQUE: Both transabdominal and transvaginal ultrasound examinations of the
pelvis were performed. Transabdominal technique was performed for
global imaging of the pelvis including uterus, ovaries, adnexal
regions, and pelvic cul-de-sac. It was necessary to proceed with
endovaginal exam following the transabdominal exam to visualize the
endometrium and ovaries.

[Series 1: us pelvis complete with transvaginal · 0.33mm/px · 13 of 55 slices shown]
[im 1/55]
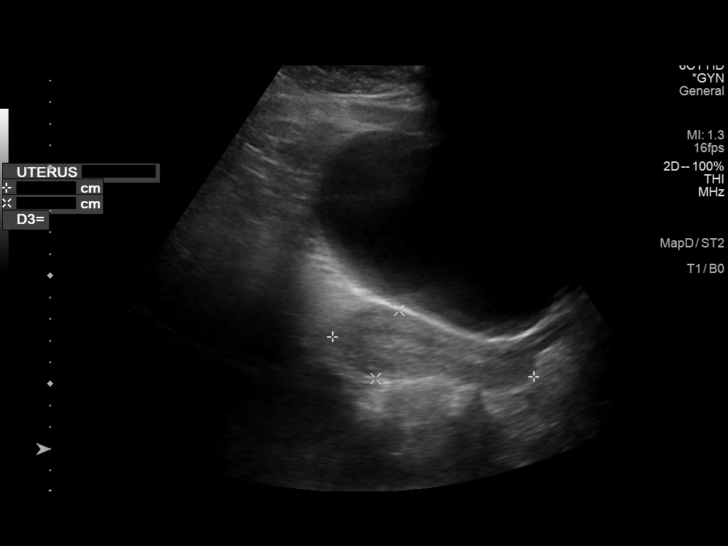
[im 5/55]
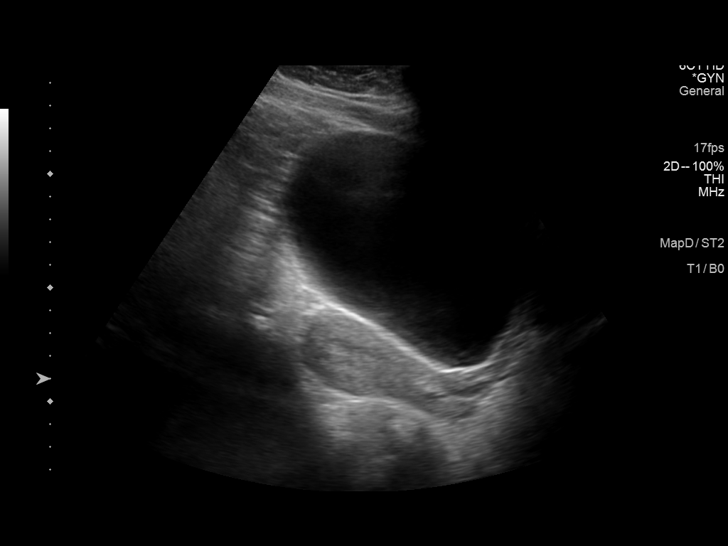
[im 10/55]
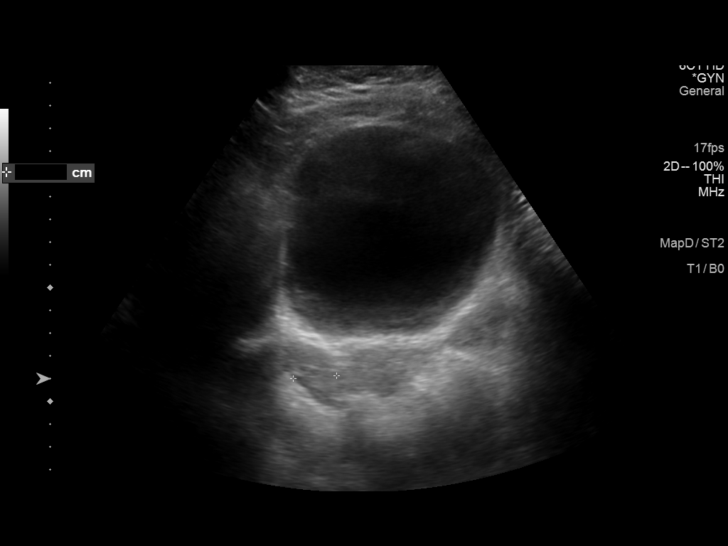
[im 14/55]
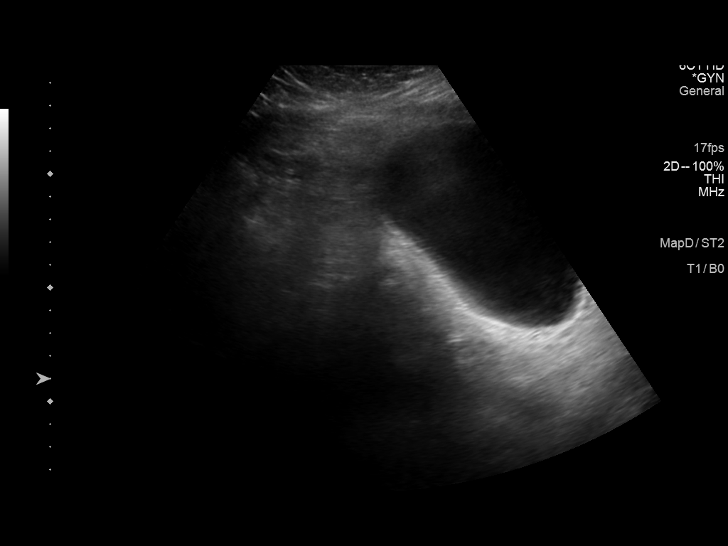
[im 19/55]
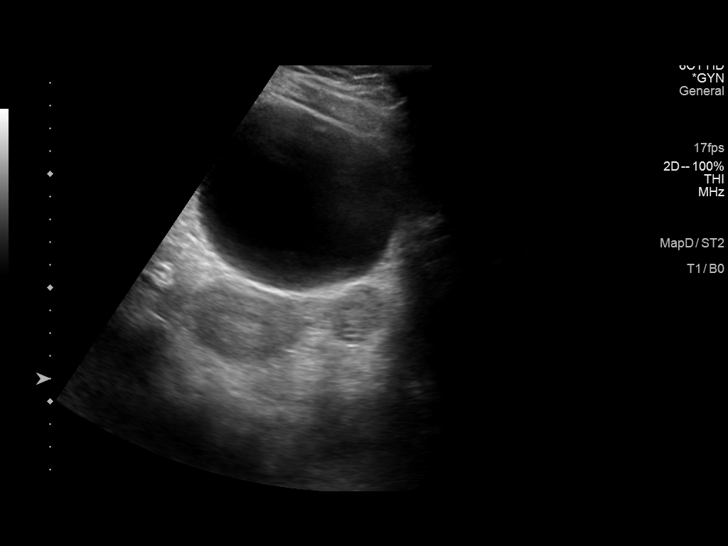
[im 23/55]
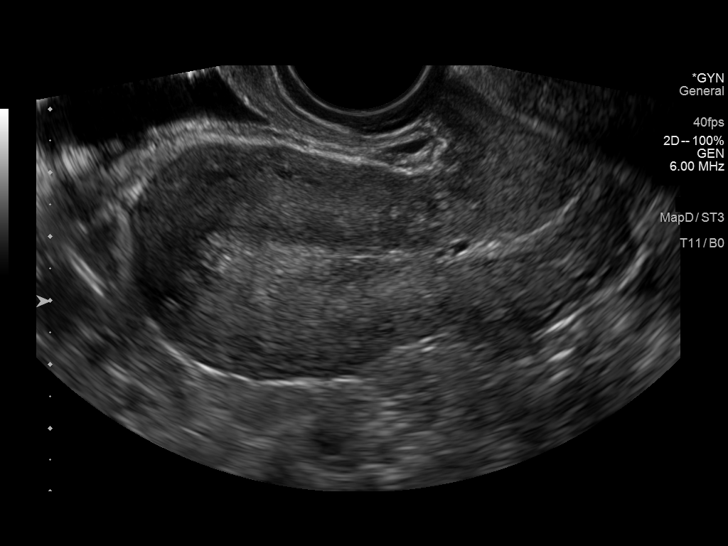
[im 28/55]
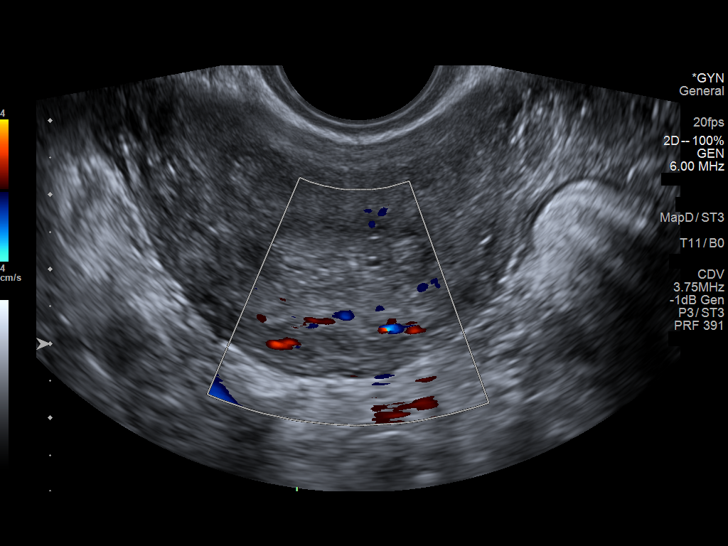
[im 32/55]
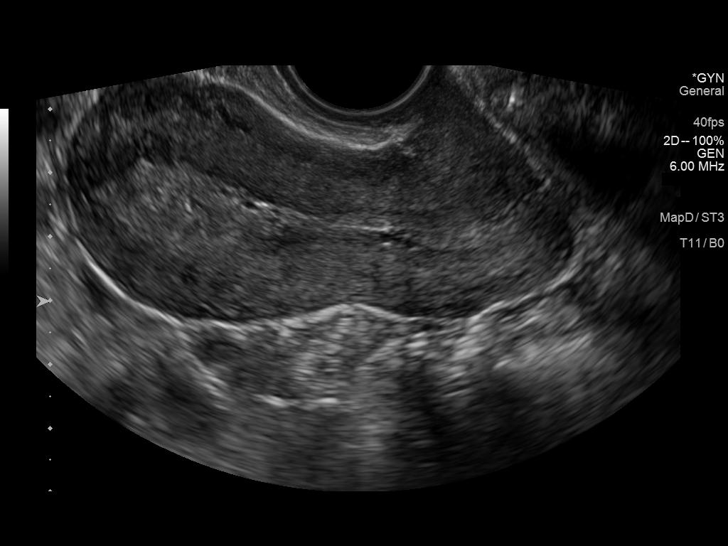
[im 37/55]
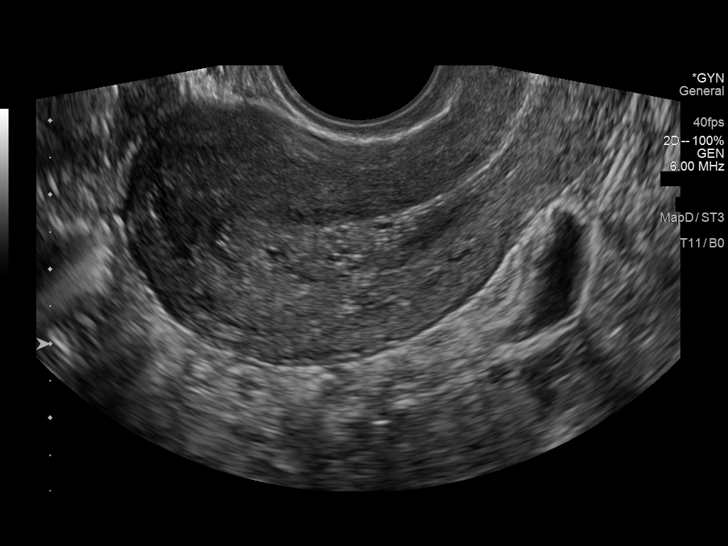
[im 41/55]
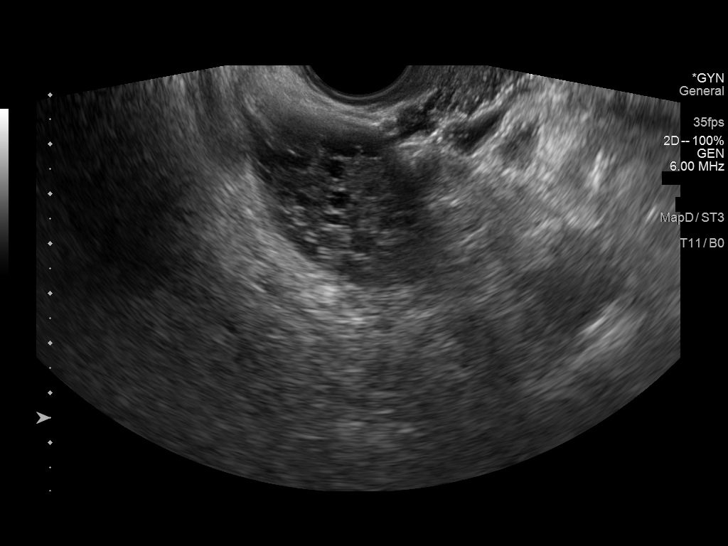
[im 46/55]
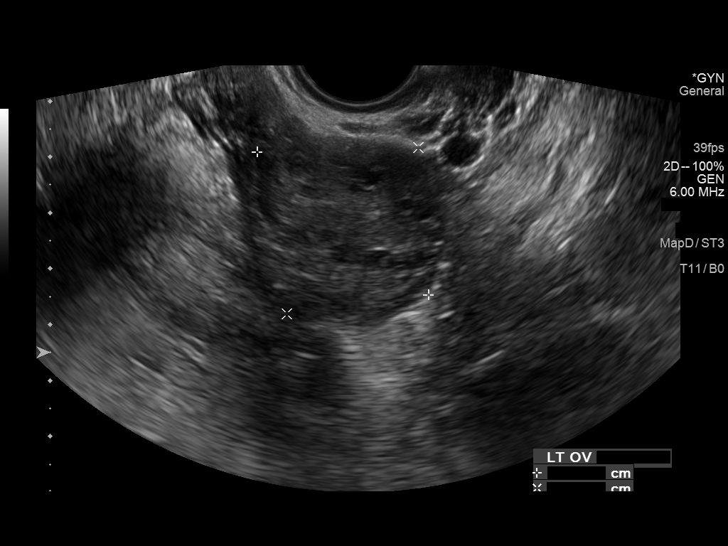
[im 50/55]
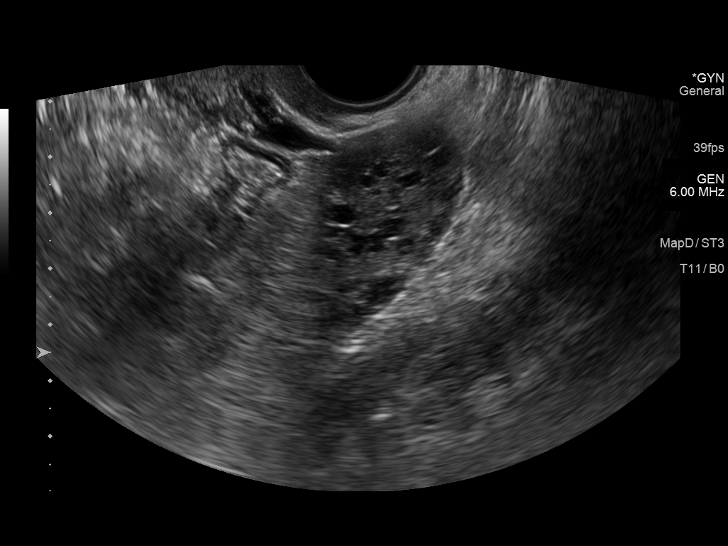
[im 55/55]
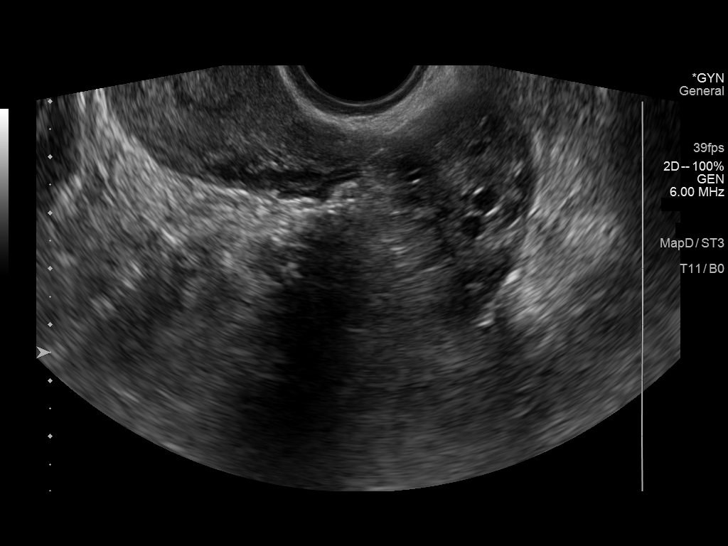

[13 of 25 positions shown; findings below may reference images not displayed]

FINDINGS: Uterus

Measurements: 9.5 x 3.3 x 5 cm = volume: 81.9 mL. No fibroids or
other mass visualized.

Endometrium

Thickness: 8.7 mm. There is inhomogeneous echogenicity in the
endometrium. There is no significant increased vascularity in the
endometrium.

Right ovary

Measurements: 4.6 x 2.9 x 3.7 cm = volume: 26.4 mL. There are
numerous subcentimeter follicles.

Left ovary

Measurements: 4 x 3.8 x 2.6 cm = volume: 20.7 mL. There are numerous
subcentimeter follicles.

Other findings

No abnormal free fluid.
IMPRESSION: There is inhomogeneous echogenicity in the endometrium without
significant increased vascularity.

There are numerous subcentimeter follicles in both ovaries. Please
correlate for possible polycystic ovary disease.

## 2024-04-06 ENCOUNTER — Other Ambulatory Visit (HOSPITAL_BASED_OUTPATIENT_CLINIC_OR_DEPARTMENT_OTHER): Payer: Self-pay

## 2024-04-06 DIAGNOSIS — E063 Autoimmune thyroiditis: Secondary | ICD-10-CM

## 2024-04-26 ENCOUNTER — Ambulatory Visit (HOSPITAL_BASED_OUTPATIENT_CLINIC_OR_DEPARTMENT_OTHER)
Admission: RE | Admit: 2024-04-26 | Discharge: 2024-04-26 | Disposition: A | Discharge status: Home / Self Care | Source: Ambulatory Visit

## 2024-04-26 DIAGNOSIS — E063 Autoimmune thyroiditis: Secondary | ICD-10-CM | POA: Insufficient documentation
